# Patient Record
Sex: Male | Born: 1980 | Race: White | Hispanic: No | Marital: Single | Smoking: Current every day smoker
Health system: Southern US, Community
[De-identification: ages and names within clinical notes are randomized; demographics above are authoritative.]

## PROBLEM LIST (undated history)

## (undated) DIAGNOSIS — I639 Cerebral infarction, unspecified: Secondary | ICD-10-CM

## (undated) HISTORY — PX: HERNIA REPAIR: SHX51

## (undated) SURGERY — ANTERIOR CERVICAL CORPECTOMY
Anesthesia: General | Laterality: Bilateral

---

## 2016-12-12 ENCOUNTER — Encounter: Payer: Self-pay | Admitting: Emergency Medicine

## 2016-12-12 ENCOUNTER — Inpatient Hospital Stay
Admission: EM | Admit: 2016-12-12 | Discharge: 2016-12-16 | DRG: 682 | Disposition: A | Discharge status: Home / Self Care | Payer: Self-pay | Attending: Internal Medicine | Admitting: Internal Medicine

## 2016-12-12 DIAGNOSIS — S30820A Blister (nonthermal) of lower back and pelvis, initial encounter: Secondary | ICD-10-CM | POA: Diagnosis present

## 2016-12-12 DIAGNOSIS — I639 Cerebral infarction, unspecified: Secondary | ICD-10-CM

## 2016-12-12 DIAGNOSIS — R740 Nonspecific elevation of levels of transaminase and lactic acid dehydrogenase [LDH]: Secondary | ICD-10-CM | POA: Diagnosis present

## 2016-12-12 DIAGNOSIS — E860 Dehydration: Secondary | ICD-10-CM | POA: Diagnosis present

## 2016-12-12 DIAGNOSIS — S1092XA Blister (nonthermal) of unspecified part of neck, initial encounter: Secondary | ICD-10-CM | POA: Diagnosis present

## 2016-12-12 DIAGNOSIS — T670XXA Heatstroke and sunstroke, initial encounter: Secondary | ICD-10-CM | POA: Diagnosis present

## 2016-12-12 DIAGNOSIS — E871 Hypo-osmolality and hyponatremia: Secondary | ICD-10-CM

## 2016-12-12 DIAGNOSIS — R7401 Elevation of levels of liver transaminase levels: Secondary | ICD-10-CM

## 2016-12-12 DIAGNOSIS — E872 Acidosis: Secondary | ICD-10-CM | POA: Diagnosis present

## 2016-12-12 DIAGNOSIS — M6282 Rhabdomyolysis: Secondary | ICD-10-CM

## 2016-12-12 DIAGNOSIS — G9341 Metabolic encephalopathy: Secondary | ICD-10-CM | POA: Diagnosis present

## 2016-12-12 DIAGNOSIS — N179 Acute kidney failure, unspecified: Principal | ICD-10-CM

## 2016-12-12 DIAGNOSIS — R7989 Other specified abnormal findings of blood chemistry: Secondary | ICD-10-CM

## 2016-12-12 DIAGNOSIS — F17210 Nicotine dependence, cigarettes, uncomplicated: Secondary | ICD-10-CM | POA: Diagnosis present

## 2016-12-12 DIAGNOSIS — E162 Hypoglycemia, unspecified: Secondary | ICD-10-CM | POA: Diagnosis present

## 2016-12-12 DIAGNOSIS — F1010 Alcohol abuse, uncomplicated: Secondary | ICD-10-CM | POA: Diagnosis present

## 2016-12-12 DIAGNOSIS — L8990 Pressure ulcer of unspecified site, unspecified stage: Secondary | ICD-10-CM | POA: Insufficient documentation

## 2016-12-12 DIAGNOSIS — R4182 Altered mental status, unspecified: Secondary | ICD-10-CM

## 2016-12-12 DIAGNOSIS — T675XXA Heat exhaustion, unspecified, initial encounter: Secondary | ICD-10-CM | POA: Diagnosis present

## 2016-12-12 DIAGNOSIS — X30XXXA Exposure to excessive natural heat, initial encounter: Secondary | ICD-10-CM

## 2016-12-12 LAB — URINALYSIS, COMPLETE (UACMP) WITH MICROSCOPIC
BACTERIA UA: NONE SEEN
BILIRUBIN URINE: NEGATIVE
Glucose, UA: 50 mg/dL — AB
KETONES UR: NEGATIVE mg/dL
Leukocytes, UA: NEGATIVE
Nitrite: NEGATIVE
PROTEIN: 30 mg/dL — AB
RBC / HPF: NONE SEEN RBC/hpf (ref 0–5)
SPECIFIC GRAVITY, URINE: 1.013 (ref 1.005–1.030)
pH: 5 (ref 5.0–8.0)

## 2016-12-12 LAB — URINE DRUG SCREEN, QUALITATIVE (ARMC ONLY)
Amphetamines, Ur Screen: NOT DETECTED
BARBITURATES, UR SCREEN: NOT DETECTED
BENZODIAZEPINE, UR SCRN: POSITIVE — AB
Cannabinoid 50 Ng, Ur ~~LOC~~: NOT DETECTED
Cocaine Metabolite,Ur ~~LOC~~: NOT DETECTED
MDMA (Ecstasy)Ur Screen: NOT DETECTED
Methadone Scn, Ur: NOT DETECTED
Opiate, Ur Screen: NOT DETECTED
Phencyclidine (PCP) Ur S: NOT DETECTED
TRICYCLIC, UR SCREEN: NOT DETECTED

## 2016-12-12 LAB — CBC
HEMATOCRIT: 45.6 % (ref 40.0–52.0)
HEMOGLOBIN: 16 g/dL (ref 13.0–18.0)
MCH: 31.4 pg (ref 26.0–34.0)
MCHC: 35.1 g/dL (ref 32.0–36.0)
MCV: 89.6 fL (ref 80.0–100.0)
PLATELETS: 353 10*3/uL (ref 150–440)
RBC: 5.09 MIL/uL (ref 4.40–5.90)
RDW: 13.1 % (ref 11.5–14.5)
WBC: 20.9 10*3/uL — ABNORMAL HIGH (ref 3.8–10.6)

## 2016-12-12 LAB — PROTIME-INR
INR: 0.98
Prothrombin Time: 13 seconds (ref 11.4–15.2)

## 2016-12-12 LAB — LACTIC ACID, PLASMA
Lactic Acid, Venous: 2.4 mmol/L (ref 0.5–1.9)
Lactic Acid, Venous: 1.2 mmol/L (ref 0.5–1.9)

## 2016-12-12 LAB — COMPREHENSIVE METABOLIC PANEL
ALBUMIN: 3.6 g/dL (ref 3.5–5.0)
ALT: 363 U/L — ABNORMAL HIGH (ref 17–63)
ANION GAP: 14 (ref 5–15)
AST: 682 U/L — AB (ref 15–41)
Alkaline Phosphatase: 75 U/L (ref 38–126)
BUN: 96 mg/dL — AB (ref 6–20)
CHLORIDE: 91 mmol/L — AB (ref 101–111)
CO2: 20 mmol/L — ABNORMAL LOW (ref 22–32)
Calcium: 8.3 mg/dL — ABNORMAL LOW (ref 8.9–10.3)
Creatinine, Ser: 4.16 mg/dL — ABNORMAL HIGH (ref 0.61–1.24)
GFR calc Af Amer: 20 mL/min — ABNORMAL LOW (ref >60)
GFR calc non Af Amer: 17 mL/min — ABNORMAL LOW (ref >60)
GLUCOSE: 124 mg/dL — AB (ref 65–99)
POTASSIUM: 4.5 mmol/L (ref 3.5–5.1)
Sodium: 125 mmol/L — ABNORMAL LOW (ref 135–145)
Total Bilirubin: 1 mg/dL (ref 0.3–1.2)
Total Protein: 7.5 g/dL (ref 6.5–8.1)

## 2016-12-12 LAB — PHOSPHORUS: PHOSPHORUS: 1.4 mg/dL — AB (ref 2.5–4.6)

## 2016-12-12 LAB — AMMONIA: AMMONIA: 11 umol/L (ref 9–35)

## 2016-12-12 LAB — CK: CK TOTAL: 43569 U/L — AB (ref 49–397)

## 2016-12-12 LAB — ETHANOL

## 2016-12-12 LAB — APTT: aPTT: 27 seconds (ref 24–36)

## 2016-12-12 MED ORDER — ACETAMINOPHEN 325 MG PO TABS: 650.0000 mg | ORAL_TABLET | Freq: Four times a day (QID) | ORAL | Status: DC | PRN

## 2016-12-12 MED ORDER — PNEUMOCOCCAL VAC POLYVALENT 25 MCG/0.5ML IJ INJ
0.5000 mL | INJECTION | INTRAMUSCULAR | Status: AC
  Administered 2016-12-15: 0.5 mL via INTRAMUSCULAR
  Filled 2016-12-12: qty 0.5

## 2016-12-12 MED ORDER — BISACODYL 5 MG PO TBEC: 5.0000 mg | DELAYED_RELEASE_TABLET | Freq: Every day | ORAL | Status: DC | PRN

## 2016-12-12 MED ORDER — SODIUM CHLORIDE 0.9 % IV SOLN
INTRAVENOUS | Status: DC
  Administered 2016-12-12: 23:00:00 via INTRAVENOUS
  Administered 2016-12-13 (×2): 1000 mL via INTRAVENOUS
  Administered 2016-12-13 – 2016-12-16 (×9): via INTRAVENOUS

## 2016-12-12 MED ORDER — ONDANSETRON HCL 4 MG/2ML IJ SOLN
4.0000 mg | Freq: Four times a day (QID) | INTRAMUSCULAR | Status: DC | PRN
  Administered 2016-12-16: 4 mg via INTRAVENOUS
  Filled 2016-12-12: qty 2

## 2016-12-12 MED ORDER — MAGNESIUM CITRATE PO SOLN
1.0000 | Freq: Once | ORAL | Status: DC | PRN
  Filled 2016-12-12: qty 296

## 2016-12-12 MED ORDER — ACETAMINOPHEN 650 MG RE SUPP: 650.0000 mg | Freq: Four times a day (QID) | RECTAL | Status: DC | PRN

## 2016-12-12 MED ORDER — ALBUTEROL SULFATE (2.5 MG/3ML) 0.083% IN NEBU: 2.5000 mg | INHALATION_SOLUTION | Freq: Four times a day (QID) | RESPIRATORY_TRACT | Status: DC | PRN

## 2016-12-12 MED ORDER — OXYCODONE HCL 5 MG PO TABS
5.0000 mg | ORAL_TABLET | ORAL | Status: DC | PRN
  Administered 2016-12-15 – 2016-12-16 (×5): 5 mg via ORAL
  Filled 2016-12-12 (×6): qty 1

## 2016-12-12 MED ORDER — SODIUM CHLORIDE 0.9 % IV BOLUS (SEPSIS)
1000.0000 mL | Freq: Once | INTRAVENOUS | Status: AC
  Administered 2016-12-12: 1000 mL via INTRAVENOUS

## 2016-12-12 MED ORDER — ZOLPIDEM TARTRATE 5 MG PO TABS: 5.0000 mg | ORAL_TABLET | Freq: Every evening | ORAL | Status: DC | PRN

## 2016-12-12 MED ORDER — ONDANSETRON HCL 4 MG PO TABS: 4.0000 mg | ORAL_TABLET | Freq: Four times a day (QID) | ORAL | Status: DC | PRN

## 2016-12-12 MED ORDER — SENNOSIDES-DOCUSATE SODIUM 8.6-50 MG PO TABS
1.0000 | ORAL_TABLET | Freq: Every evening | ORAL | Status: DC | PRN
  Filled 2016-12-12: qty 1

## 2016-12-12 MED ORDER — IPRATROPIUM BROMIDE 0.02 % IN SOLN: 0.5000 mg | Freq: Four times a day (QID) | RESPIRATORY_TRACT | Status: DC | PRN

## 2016-12-12 NOTE — ED Triage Notes (Signed)
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  Passed out in the [place], with the door open, and was found Sunday morning.  Patient was confused.  Patient was "cooled down" by family.  Had some vomiting.  Started drinking fluids Sunday night and Monday.  Confusion persists.

## 2016-12-12 NOTE — ED Provider Notes (Signed)
[facility] Emergency Department Provider Note  ________________________________________   I have reviewed the triage vital signs and the nursing notes.   HISTORY  Chief Complaint Altered Mental Status   History limited by: Altered mental status, history primarily obtained from wife   HPI Donald Becker is a 36 y.o. male who presents to the emergency department today because of concerns for continued altered mental status. The wife states that he was last seen normal 3 mornings ago. 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 After coming home he had a sixpack of beer. Wife states that he passed out in his [place]. She states it was not running. The next morning the family was having a hard time waking him. He did spend the night in the [place]. Eventually they were able to get him on the ground and they tried calling him off with a hose and water. They then brought him into the garage where they kept fans on him. The family continue to watch the patient yesterday. He however did not recover his mental status. The patient himself denies any pain. He denies any headaches.   History reviewed. No pertinent past medical history.  There are no active problems to display for this patient.   History reviewed. No pertinent surgical history.  Prior to Admission medications   Not on File    Allergies Patient has no known allergies.  No family history on file.  Social History Social History  Substance Use Topics  . Smoking status: Current Every Day Smoker    Types: Cigarettes  . Smokeless tobacco: Never Used  . Alcohol use Yes     Comment: 2-3 days per week    Review of Systems Unable to obtain reliable ROS given AMS  ____________________________________________   PHYSICAL EXAM:  VITAL SIGNS: ED Triage Vitals  Enc Vitals Group     BP 12/12/16 1721 (!) 136/94     Pulse Rate 12/12/16 1721 (!) 102     Resp 12/12/16 1721 16     Temp 12/12/16 1721 98.7  F (37.1 C)     Temp Source 12/12/16 1721 Oral     SpO2 12/12/16 1721 96 %     Weight 12/12/16 1718 250 lb (113.4 kg)     Height 12/12/16 1718 5\' 11"  (1.803 m)   Constitutional: Awake and alert. Not oriented to events, location or date. Eyes: Conjunctivae are normal.  ENT   Head: Normocephalic and atraumatic.   Nose: No congestion/rhinnorhea.   Mouth/Throat: Mucous membranes are moist.   Neck: No stridor. Hematological/Lymphatic/Immunilogical: No cervical lymphadenopathy. Cardiovascular: Tachycardic, regular rhythm.  No murmurs, rubs, or gallops.  Respiratory: Normal respiratory effort without tachypnea nor retractions. Breath sounds are clear and equal bilaterally. No wheezes/rales/rhonchi. Gastrointestinal: Soft and non tender. No rebound. No guarding.  Genitourinary: Deferred Musculoskeletal: Normal range of motion in all extremities. No lower extremity edema. Neurologic:  Awake and alert. Not oriented to event, place or year. Normal speech and language.  Skin:  Skin is warm, dry and intact. No rash noted.  ____________________________________________    LABS (pertinent positives/negatives)  Labs Reviewed  COMPREHENSIVE METABOLIC PANEL - Abnormal; Notable for the following:       Result Value   Sodium 125 (*)    Chloride 91 (*)    CO2 20 (*)    Glucose, Bld 124 (*)    BUN 96 (*)    Creatinine, Ser 4.16 (*)    Calcium 8.3 (*)    AST  682 (*)    ALT 363 (*)    GFR calc non Af Amer 17 (*)    GFR calc Af Amer 20 (*)    All other components within normal limits  CBC - Abnormal; Notable for the following:    WBC 20.9 (*)    All other components within normal limits  URINALYSIS, COMPLETE (UACMP) WITH MICROSCOPIC - Abnormal; Notable for the following:    Color, Urine YELLOW (*)    APPearance HAZY (*)    Glucose, UA 50 (*)    Hgb urine dipstick LARGE (*)    Protein, ur 30 (*)    Squamous Epithelial / LPF 0-5 (*)    All other components within normal limits   URINE DRUG SCREEN, QUALITATIVE ([facility] ONLY) - Abnormal; Notable for the following:    Benzodiazepine, Ur Scrn POSITIVE (*)    All other components within normal limits  CK - Abnormal; Notable for the following:    Total CK 43,569 (*)    All other components within normal limits  LACTIC ACID, PLASMA - Abnormal; Notable for the following:    Lactic Acid, Venous 2.4 (*)    All other components within normal limits  PHOSPHORUS - Abnormal; Notable for the following:    Phosphorus 1.4 (*)    All other components within normal limits  ETHANOL  AMMONIA  LACTIC ACID, PLASMA  PROTIME-INR  APTT  HIV ANTIBODY (ROUTINE TESTING)  MAGNESIUM  PHOSPHORUS  CBC  COMPREHENSIVE METABOLIC PANEL  HEPATITIS PANEL, ACUTE  CBG MONITORING, ED     ____________________________________________   EKG  I, Phineas Semen, attending physician, personally viewed and interpreted this EKG  EKG Time: 2128 Rate: 80 Rhythm: normal sinus rhythm Axis: right axis deviation Intervals: qtc 427 QRS: narrow ST changes: no st elevation Impression: abnormal ekg   ____________________________________________    RADIOLOGY  None  ____________________________________________   PROCEDURES  Procedures  CRITICAL CARE Performed by: Phineas Semen   Total critical care time: 35 minutes  Critical care time was exclusive of separately billable procedures and treating other patients.  Critical care was necessary to treat or prevent imminent or life-threatening deterioration.  Critical care was time spent personally by me on the following activities: development of treatment plan with patient and/or surrogate as well as nursing, discussions with consultants, evaluation of patient's response to treatment, examination of patient, obtaining history from patient or surrogate, ordering and performing treatments and interventions, ordering and review of laboratory studies, ordering and review of radiographic  studies, pulse oximetry and re-evaluation of patient's condition.  ____________________________________________   INITIAL IMPRESSION / ASSESSMENT AND PLAN / ED COURSE  Pertinent labs & imaging results that were available during my care of the patient were reviewed by me and considered in my medical decision making (see chart for details).  Patient presents to the emergency department today because of concerns for altered mental status. This unfortunate has been going on for a few days. The patient's blood work had multiple significant derangements including elevated creatinine and AST and ALT. These findings were concerning for acute stroke. This could explain the patient's altered mental status and would fit with history. CK was added on and was again grossly elevated. Patient was started on IV fluids here in the emergency department. Patient will be admitted to the hospital service.  ____________________________________________   FINAL CLINICAL IMPRESSION(S) / ED DIAGNOSES  Final diagnoses:  AKI (acute kidney injury) (HCC)  Altered mental status, unspecified altered mental status type  Elevated lactic  acid level  Hyponatremia  Non-traumatic rhabdomyolysis  Elevated AST (SGOT)  Elevated ALT measurement     Note: This dictation was prepared with Dragon dictation. Any transcriptional errors that result from this process are unintentional     Phineas SemenGoodman, Atwell Mcdanel, MD 12/12/16 [phone]
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
## 2016-12-12 NOTE — ED Notes (Signed)
Urine sent

## 2016-12-13 LAB — COMPREHENSIVE METABOLIC PANEL
ALT: 319 U/L — AB (ref 17–63)
AST: 499 U/L — ABNORMAL HIGH (ref 15–41)
Albumin: 3.1 g/dL — ABNORMAL LOW (ref 3.5–5.0)
Alkaline Phosphatase: 63 U/L (ref 38–126)
Anion gap: 8 (ref 5–15)
BUN: 86 mg/dL — ABNORMAL HIGH (ref 6–20)
CHLORIDE: 101 mmol/L (ref 101–111)
CO2: 22 mmol/L (ref 22–32)
Calcium: 7.8 mg/dL — ABNORMAL LOW (ref 8.9–10.3)
Creatinine, Ser: 3.36 mg/dL — ABNORMAL HIGH (ref 0.61–1.24)
GFR, EST AFRICAN AMERICAN: 26 mL/min — AB (ref >60)
GFR, EST NON AFRICAN AMERICAN: 22 mL/min — AB (ref >60)
Glucose, Bld: 101 mg/dL — ABNORMAL HIGH (ref 65–99)
Potassium: 3.9 mmol/L (ref 3.5–5.1)
Sodium: 131 mmol/L — ABNORMAL LOW (ref 135–145)
Total Bilirubin: 0.8 mg/dL (ref 0.3–1.2)
Total Protein: 6.4 g/dL — ABNORMAL LOW (ref 6.5–8.1)

## 2016-12-13 LAB — CBC
HCT: 40.1 % (ref 40.0–52.0)
Hemoglobin: 14.1 g/dL (ref 13.0–18.0)
MCH: 31.6 pg (ref 26.0–34.0)
MCHC: 35.2 g/dL (ref 32.0–36.0)
MCV: 89.7 fL (ref 80.0–100.0)
PLATELETS: 177 10*3/uL (ref 150–440)
RBC: 4.47 MIL/uL (ref 4.40–5.90)
RDW: 12.9 % (ref 11.5–14.5)
WBC: 15 10*3/uL — ABNORMAL HIGH (ref 3.8–10.6)

## 2016-12-13 LAB — CK: CK TOTAL: 38497 U/L — AB (ref 49–397)

## 2016-12-13 LAB — MAGNESIUM: MAGNESIUM: 2.4 mg/dL (ref 1.7–2.4)

## 2016-12-13 LAB — PHOSPHORUS: Phosphorus: 5.3 mg/dL — ABNORMAL HIGH (ref 2.5–4.6)

## 2016-12-13 LAB — GLUCOSE, CAPILLARY: GLUCOSE-CAPILLARY: 128 mg/dL — AB (ref 65–99)

## 2016-12-13 MED ORDER — SILVER SULFADIAZINE 1 % EX CREA
TOPICAL_CREAM | Freq: Two times a day (BID) | CUTANEOUS | Status: DC
  Administered 2016-12-13 – 2016-12-16 (×7): via TOPICAL
  Filled 2016-12-13: qty 25

## 2016-12-13 MED ORDER — ZINC OXIDE 40 % EX OINT
TOPICAL_OINTMENT | Freq: Two times a day (BID) | CUTANEOUS | Status: DC
  Administered 2016-12-13 – 2016-12-16 (×7): via TOPICAL
  Filled 2016-12-13: qty 57
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
## 2016-12-13 NOTE — Progress Notes (Signed)
[facility] - [place] [facility]   PATIENT NAME: Donald Becker    MR#:  [id number]  DATE OF BIRTH:  Oct 23, 1980  SUBJECTIVE:   Family room. Patient was found in his truck for unknown amount of hours dehydrated. On Tuesday he began to have some confusion so family brought him to the ER for further evaluation where he was diagnosed with acute rhabdomyolysis.  REVIEW OF SYSTEMS:    Review of Systems  Patient is just snoring he is arousable and apparently was talking with the family but right now does not purchase. Review of systems   Tolerating Diet:yes      DRUG ALLERGIES:  No Known Allergies  VITALS:  Blood pressure 139/81, pulse 77, temperature 98 F (36.7 C), temperature source Oral, resp. rate 20, height 5\' 11"  (1.803 m), weight 118.6 kg (261 lb 6.4 oz), SpO2 97 %.  PHYSICAL EXAMINATION:  Constitutional: Appears well-developed and well-nourished. No distress. HENT: Normocephalic. . Eyes: Conjunctivae and EOM are normal. PERRLA, no scleral icterus.  Neck:No JVD. No tracheal deviation. CVS: RRR, S1/S2 +, no murmurs, no gallops, no carotid bruit.  Pulmonary: Effort and breath sounds normal, no stridor, rhonchi, wheezes, rales.  Abdominal: Soft. BS +,  no distension, tenderness, rebound or guarding.  Musculoskeletal: Normal range of motion. No edema and no tenderness.  Neuro:Sleeping and snoring  Skin: Skin is warm and dry. No rash noted. Psychiatric: Unable to assess   LABORATORY PANEL:   CBC  Recent Labs Lab 12/13/16 0446  WBC 15.0*  HGB 14.1  HCT 40.1  PLT 177   ------------------------------------------------------------------------------------------------------------------  Chemistries   Recent Labs Lab 12/12/16 2314 12/13/16 0446  NA  --  131*  K  --  3.9  CL  --  101  CO2  --  22  GLUCOSE  --  101*  BUN  --  86*  CREATININE  --  3.36*  CALCIUM  --  7.8*  MG 2.4  --   AST  --  499*  ALT  --  319*  ALKPHOS  --  63  BILITOT   --  0.8   ------------------------------------------------------------------------------------------------------------------  Cardiac Enzymes No results for input(s): TROPONINI in the last 168 hours. ------------------------------------------------------------------------------------------------------------------  RADIOLOGY:  No results found.   ASSESSMENT AND PLAN:   36 year old male with no past medical history who is admitted with acute encephalopathy and acute rhabdomyolysis.  1. Acute metabolic encephalopathy in setting of acute kidney injury and acute rhabdomyolysis Mental status has improved and is at baseline as per family.  2. Acute rhabdo is due to heat exhaustion: Continue aggressive hydration. Continue to monitor BMP CK pending  3. Acute kidney injury in the setting of dehydration and rhabdo Continue aggressive hydration and repeat BMP in a.m. Creatinine showing improvement  4. Transaminitis: This is due to dehydration with mild ischemia and EtOH abuse AST and ALT are improving 5. Hyponatremia due to dehydration: This is improved with IV fluids 6. Leukocytosis purely due to dehydration which is improving with IV fluids    Management plans discussed with the patient's family and they are in agreement.  CODE STATUS: full  TOTAL TIME TAKING CARE OF THIS PATIENT: 30 minutes.     POSSIBLE D/C 2 days, DEPENDING ON CLINICAL CONDITION.   Donald Becker M.D on 12/13/2016 at 9:22 AM  Between 7am to 6pm - Pager - [phone] After 6pm go to [email or web address] - Social research officer, government  [facility] [place] [facility]  Office  [phone]  CC: Primary care physician;  Patient, No Pcp Per  Note: This dictation was prepared with Dragon dictation along with smaller phrase technology. Any transcriptional errors that result from this process are unintentional.

## 2016-12-14 ENCOUNTER — Inpatient Hospital Stay: Payer: Self-pay

## 2016-12-14 DIAGNOSIS — L8990 Pressure ulcer of unspecified site, unspecified stage: Secondary | ICD-10-CM | POA: Insufficient documentation

## 2016-12-14 LAB — HEPATIC FUNCTION PANEL
ALT: 257 U/L — ABNORMAL HIGH (ref 17–63)
AST: 314 U/L — ABNORMAL HIGH (ref 15–41)
Albumin: 2.9 g/dL — ABNORMAL LOW (ref 3.5–5.0)
Alkaline Phosphatase: 67 U/L (ref 38–126)
BILIRUBIN DIRECT: 0.1 mg/dL (ref 0.1–0.5)
BILIRUBIN INDIRECT: 0.5 mg/dL (ref 0.3–0.9)
Total Bilirubin: 0.6 mg/dL (ref 0.3–1.2)
Total Protein: 6.1 g/dL — ABNORMAL LOW (ref 6.5–8.1)

## 2016-12-14 LAB — BASIC METABOLIC PANEL
Anion gap: 6 (ref 5–15)
BUN: 53 mg/dL — AB (ref 6–20)
CO2: 24 mmol/L (ref 22–32)
CREATININE: 1.91 mg/dL — AB (ref 0.61–1.24)
Calcium: 8 mg/dL — ABNORMAL LOW (ref 8.9–10.3)
Chloride: 105 mmol/L (ref 101–111)
GFR, EST AFRICAN AMERICAN: 51 mL/min — AB (ref >60)
GFR, EST NON AFRICAN AMERICAN: 44 mL/min — AB (ref >60)
Glucose, Bld: 114 mg/dL — ABNORMAL HIGH (ref 65–99)
Potassium: 3.9 mmol/L (ref 3.5–5.1)
SODIUM: 135 mmol/L (ref 135–145)

## 2016-12-14 LAB — CK
CK TOTAL: 14240 U/L — AB (ref 49–397)
CK TOTAL: 12065 U/L — AB (ref 49–397)

## 2016-12-14 LAB — HIV ANTIBODY (ROUTINE TESTING W REFLEX): HIV SCREEN 4TH GENERATION: NONREACTIVE

## 2016-12-14 MED ORDER — ZINC OXIDE 40 % EX OINT: TOPICAL_OINTMENT | Freq: Two times a day (BID) | CUTANEOUS | 0 refills | Status: DC

## 2016-12-14 MED ORDER — ASPIRIN EC 81 MG PO TBEC
81.0000 mg | DELAYED_RELEASE_TABLET | Freq: Every day | ORAL | Status: DC
  Administered 2016-12-14 – 2016-12-16 (×3): 81 mg via ORAL
  Filled 2016-12-14 (×3): qty 1

## 2016-12-14 MED ORDER — SILVER SULFADIAZINE 1 % EX CREA: TOPICAL_CREAM | Freq: Two times a day (BID) | CUTANEOUS | 0 refills | Status: DC

## 2016-12-14 NOTE — Progress Notes (Signed)
[facility] - [place] [facility]   PATIENT NAME: Donald Becker    MR#:  [id number]  DATE OF BIRTH:  05/01/1981  SUBJECTIVE:   Patient is alert this am not c/o pain  Family worried about mental confusion  REVIEW OF SYSTEMS:      Review of Systems  Constitutional: Negative for fever, chills weight loss HENT: Negative for ear pain, nosebleeds, congestion, facial swelling, rhinorrhea, neck pain, neck stiffness and ear discharge.   Respiratory: Negative for cough, shortness of breath, wheezing  Cardiovascular: Negative for chest pain, and leg swelling.  Palpitations when ambulating Gastrointestinal: Negative for heartburn, abdominal pain, vomiting, diarrhea or consitpation Genitourinary: Negative for dysuria, urgency, frequency, hematuria Musculoskeletal: Negative for back pain or joint pain Neurological: Negative for dizziness, seizures, syncope, focal weakness,  numbness and headaches.  Hematological: Does not bruise/bleed easily.  Psychiatric/Behavioral: Negative for hallucinations, confusion, dysphoric mood  Tolerating Diet:yes      DRUG ALLERGIES:  No Known Allergies  VITALS:  Blood pressure 120/70, pulse 77, temperature 98.1 F (36.7 C), temperature source Oral, resp. rate 19, height 5\' 11"  (1.803 m), weight 118.6 kg (261 lb 6.4 oz), SpO2 99 %.  PHYSICAL EXAMINATION:  Constitutional: Appears well-developed and well-nourished. No distress. HENT: Normocephalic. . Eyes: Conjunctivae and EOM are normal. PERRLA, no scleral icterus.  Neck:No JVD. No tracheal deviation. CVS: RRR, S1/S2 +, no murmurs, no gallops, no carotid bruit.  Pulmonary: Effort and breath sounds normal, no stridor, rhonchi, wheezes, rales.  Abdominal: Soft. BS +,  no distension, tenderness, rebound or guarding.  Musculoskeletal: Normal range of motion. No edema and no tenderness.  Neuro: CN 2-12 intact Skin: swelling back of neck with bumps Psychiatric:normal mood  LABORATORY  PANEL:   CBC  Recent Labs Lab 12/13/16 0446  WBC 15.0*  HGB 14.1  HCT 40.1  PLT 177   ------------------------------------------------------------------------------------------------------------------  Chemistries   Recent Labs Lab 12/12/16 2314  12/14/16 0419  NA  --   < > 135  K  --   < > 3.9  CL  --   < > 105  CO2  --   < > 24  GLUCOSE  --   < > 114*  BUN  --   < > 53*  CREATININE  --   < > 1.91*  CALCIUM  --   < > 8.0*  MG 2.4  --   --   AST  --   < > 314*  ALT  --   < > 257*  ALKPHOS  --   < > 67  BILITOT  --   < > 0.6  < > = values in this interval not displayed. ------------------------------------------------------------------------------------------------------------------  Cardiac Enzymes No results for input(s): TROPONINI in the last 168 hours. ------------------------------------------------------------------------------------------------------------------  RADIOLOGY:  Ct Head Wo Contrast  Result Date: 12/14/2016 CLINICAL DATA:  Altered mental status. EXAM: CT HEAD WITHOUT CONTRAST TECHNIQUE: Contiguous axial images were obtained from the base of the skull through the vertex without intravenous contrast. COMPARISON:  None. 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 Bilateral basal ganglia lacunar infarcts also noted, left more pronounced than right. These are age indeterminate. No hemorrhage or hydrocephalus. Vascular: No hyperdense vessel or unexpected calcification. Skull: No acute calvarial abnormality. Sinuses/Orbits: Visualized paranasal sinuses and mastoids clear. Orbital soft tissues unremarkable. Other: Soft tissue swelling/ low-density fluid noted in the posterior scalp. IMPRESSION: Areas of age-indeterminate infarction and both occipital lobes and bilateral basal  ganglia. Electronically Signed   By: Charlett Nose M.D.   On: 12/14/2016 15:38     ASSESSMENT AND PLAN:    36 year old male with no past medical history who is admitted with acute encephalopathy and acute rhabdomyolysis.  1. Acute metabolic encephalopathy in setting of acute kidney injury and acute rhabdomyolysis Mental status has improved. Today family concerned will order CT HEAD  2. Acute rhabdo is due to heat exhaustion: Continue aggressive hydration. Continue to monitor BMP CK pending  3. Acute kidney injury in the setting of dehydration and rhabdo Continue aggressive hydration and repeat BMP in a.m. Creatinine showing improvement  4. Transaminitis: This is due to dehydration with mild ischemia and EtOH abuse AST and ALT are improving 5. Hyponatremia due to dehydration: This is improved with IV fluids 6. Leukocytosis purely due to dehydration which is improving with IV fluids    Management plans discussed with the patient's family and they are in agreement.  CODE STATUS: full  TOTAL TIME TAKING CARE OF THIS PATIENT: 25 minutes.     POSSIBLE D/C 2 days, DEPENDING ON CLINICAL CONDITION.   Jatasia Gundrum M.D on 12/14/2016 at 8:16 PM  Between 7am to 6pm - Pager - [phone] After 6pm go to [email or web address] - password [email or web address]  [facility]  Office  [phone]  CC: Primary care physician; Patient, No Pcp Per  Note: This dictation was prepared with Dragon dictation along with smaller phrase technology. Any transcriptional errors that result from this process are unintentional.

## 2016-12-14 NOTE — Progress Notes (Addendum)
Okay per Dr. Mody for MRiJuliene Becker to be done tomorrow. Per Dr. [name] pt does not need to transfer to 1C at this time as no orders for a stoke assessment have been placed.

## 2016-12-14 NOTE — Progress Notes (Signed)
HEad CT shows possible CVA D/w dr Thad Rangerreynolds not CODE STROKE Oder  Mri/mra Echo Carotids Asa

## 2016-12-14 NOTE — Progress Notes (Signed)
Spoke with Dr. [name] and she is aware of Ct results.

## 2016-12-14 NOTE — Progress Notes (Signed)
Per Dr. [name] place order for CT of head without contrast.

## 2016-12-14 NOTE — Care Management (Signed)
Patient admitted with Rhabdomyolysis secondary to heat stroke.  Patient lives at home with spouse.  Patient is uninsured.  [facility]  PT has assessed patient and recommend home health PT. Patient does not qualify for home health PT services, as he does not have insurance.  He also does not have a PCP who could make a referral [facility].  Patient was able to ambulate 300 feet with PT.  Patient was provided a RW that was [facility].  Patient is not discharging on any medications that require medication assistance.  RNCM signing off.

## 2016-12-14 NOTE — Discharge Instructions (Signed)
Acute Kidney Injury, Adult Acute kidney injury is a sudden worsening of kidney function. The kidneys are organs that have several jobs. They filter the blood to remove waste products and extra fluid. They also maintain a healthy balance of minerals and hormones in the body, which helps control blood pressure and keep bones strong. With this condition, your kidneys do not do their jobs as well as they should. This condition ranges from mild to severe. Over time it may develop into long-lasting (chronic) kidney disease. Early detection and treatment may prevent acute kidney injury from developing into a chronic condition. What are the causes? Common causes of this condition include:  A problem with blood flow to the kidneys. This may be caused by:  Low blood pressure (hypotension) or shock.  Blood loss.  Heart and blood vessel (cardiovascular) disease.  Severe burns.  Liver disease.  Direct damage to the kidneys. This may be caused by:  Certain medicines.  A kidney infection.  Poisoning.  Being around or in contact with toxic substances.  A surgical wound.  A hard, direct hit to the kidney area.  A sudden blockage of urine flow. This may be caused by:  Cancer.  Kidney stones.  An enlarged prostate in males. What are the signs or symptoms? Symptoms of this condition may not be obvious until the condition becomes severe. Symptoms of this condition can include:  Tiredness (lethargy), or difficulty staying awake.  Nausea or vomiting.  Swelling (edema) of the face, legs, ankles, or feet.  Problems with urination, such as:  Abdominal pain, or pain along the side of your stomach (flank).  Decreased urine production.  Decrease in the force of urine flow.  Muscle twitches and cramps, especially in the legs.  Confusion or trouble concentrating.  Loss of appetite.  Fever. How is this diagnosed? This condition may be diagnosed with tests, including:  Blood  tests.  Urine tests.  Imaging tests.  A test in which a sample of tissue is removed from the kidneys to be examined under a microscope (kidney biopsy). How is this treated? Treatment for this condition depends on the cause and how severe the condition is. In mild cases, treatment may not be needed. The kidneys may heal on their own. In more severe cases, treatment will involve:  Treating the cause of the kidney injury. This may involve changing any medicines you are taking or adjusting your dosage.  Fluids. You may need specialized IV fluids to balance your body's needs.  Having a catheter placed to drain urine and prevent blockages.  Preventing problems from occurring. This may mean avoiding certain medicines or procedures that can cause further injury to the kidneys. In some cases treatment may also require:  A procedure to remove toxic wastes from the body (dialysis or continuous renal replacement therapy - CRRT).  Surgery. This may be done to repair a torn kidney, or to remove the blockage from the urinary system. Follow these instructions at home: Medicines   Take over-the-counter and prescription medicines only as told by your health care provider.  Do not take any new medicines without your health care provider's approval. Many medicines can worsen your kidney damage.  Do not take any vitamin and mineral supplements without your health care provider's approval. Many nutritional supplements can worsen your kidney damage. Lifestyle   If your health care provider prescribed changes to your diet, follow them. You may need to decrease the amount of protein you eat.  Achieve and maintain a   healthy weight. If you need help with this, ask your health care provider.  Start or continue an exercise plan. Try to exercise at least 30 minutes a day, 5 days a week.  Do not use any tobacco products, such as cigarettes, chewing tobacco, and e-cigarettes. If you need help quitting, ask  your health care provider. General instructions   Keep track of your blood pressure. Report changes in your blood pressure as told by your health care provider.  Stay up to date with immunizations. Ask your health care provider which immunizations you need.  Keep all follow-up visits as told by your health care provider. This is important. Where to find more information:  American Association of Kidney Patients: www.aakp.org  National Kidney Foundation: www.kidney.org  American Kidney Fund: www.akfinc.org  Life Options Rehabilitation Program:  www.lifeoptions.org  www.kidneyschool.org Contact a health care provider if:  Your symptoms get worse.  You develop new symptoms. Get help right away if:  You develop symptoms of worsening kidney disease, which include:  Headaches.  Abnormally dark or light skin.  Easy bruising.  Frequent hiccups.  Chest pain.  Shortness of breath.  End of menstruation in women.  Seizures.  Confusion or altered mental status.  Abdominal or back pain.  Itchiness.  You have a fever.  Your body is producing less urine.  You have pain or bleeding when you urinate. Summary  Acute kidney injury is a sudden worsening of kidney function.  Acute kidney injury can be caused by problems with blood flow to the kidneys, direct damage to the kidneys, and sudden blockage of urine flow.  Symptoms of this condition may not be obvious until it becomes severe. Symptoms may include edema, lethargy, confusion, nausea or vomiting, and problems passing urine.  This condition can usually be diagnosed with blood tests, urine tests, and imaging tests. Sometimes a kidney biopsy is done to diagnose this condition.  Treatment for this condition often involves treating the underlying cause. It is treated with fluids, medicines, dialysis, diet changes, or surgery. This information is not intended to replace advice given to you by your health care provider.  Make sure you discuss any questions you have with your health care provider. Document Released: 01/30/2011 Document Revised: 07/07/2016 Document Reviewed: 07/07/2016 Elsevier Interactive Patient Education  2017 [employer]  

## 2016-12-14 NOTE — Discharge Summary (Signed)
[facility] [place] [facility]   PATIENT NAME: Donald Becker    MR#:  [id number]  DATE OF BIRTH:  1981-05-25  DATE OF ADMISSION:  12/12/2016 ADMITTING PHYSICIAN: Tonye Royalty, DO  DATE OF DISCHARGE: 12/16/2016  PRIMARY CARE PHYSICIAN: [facility]    ADMISSION DIAGNOSIS:  AKI (acute kidney injury) (HCC) [N17.9] Elevated lactic acid level [R79.89] Altered mental status, unspecified altered mental status type [R41.82]  DISCHARGE DIAGNOSIS:  Active Problems:   AKI (acute kidney injury) (HCC)   Pressure injury of skin   Altered mental status hypoxic brain injury causing CVA  SECONDARY DIAGNOSIS:  none  HOSPITAL COURSE:   36 year old male with no past medical history who is admitted with acute encephalopathy and acute rhabdomyolysis.  1. Acute metabolic encephalopathy in setting of acute kidney injury and acute rhabdomyolysis and hypoxic brain injury causing CVA  He underwent MRI brain which shows Symmetric injury/infarct of the hippocampus, globus pallidus, cerebellum, and occipital cortex. This is the pattern of hypoxic ischemic injury or toxic/metabolic insult (hypoglycemia, carbon monoxide, ect). ECHO shows mild diastolic dysfunction with normal EF no thrombus.   2. Acute rhabdo is due to heat exhaustion: CPK is trending down with aggressive IV fluids. Patient will continue with hydration at home. Is recommended that he drink at least a gallon of water daily for the next few days. He will need to drink plenty of fluids while he is out in the heat. This was discussed with patient and family.   3. Acute kidney injury in the setting of dehydration and rhabdo  Creatinine has much improved.  4. Transaminitis: This is due to dehydration with mild ischemia and EtOH abuse AST and ALT have improved.  5. Hyponatremia due to dehydration: This has  improved with IV fluids 6. Leukocytosis purely due to dehydration which is improving with IV  fluids  7. NECK /butoock cellulitis/induration:He was evaluated by Surgery and Wound care. He will continue Keflex and :Cleanse neck with soap and water. Apply Silvadene cream, cover with nonadherent telfa, Gauze and tape. Change twice daily. Cleanse right flank and buttocks with soap and water.Apply Boudreaux's butt paste to erythema twice daily  8. Mild diastolic dysfunction on ECHO: He needs [facility] for BP and may benefit from overnight Sleep Study.   DISCHARGE CONDITIONS AND DIET:   Stable for discharge on regular diet   CONSULTS OBTAINED:  Treatment Team:  Lattie Haw, MD Thana Farr, MD  DRUG ALLERGIES:  No Known Allergies  DISCHARGE MEDICATIONS:   Current Discharge Medication List    START taking these medications   Details  aspirin EC 81 MG EC tablet Take 1 tablet (81 mg total) by mouth daily.    cephALEXin (KEFLEX) 500 MG capsule Take 1 capsule (500 mg total) by mouth every 8 (eight) hours. Qty: 18 capsule, Refills: 0    liver oil-zinc oxide (DESITIN) 40 % ointment Apply topically 2 (two) times daily. Qty: 56.7 g, Refills: 0    nicotine (NICODERM CQ - DOSED IN MG/24 HOURS) 14 mg/24hr patch Place 1 patch (14 mg total) onto the skin daily. Qty: 28 patch, Refills: 0    silver sulfADIAZINE (SILVADENE) 1 % cream Apply topically 2 (two) times daily. Qty: 50 g, Refills: 0          Today   CHIEF COMPLAINT:  No acute events. Patient denies pain or shortness of breath   VITAL SIGNS:  Blood pressure 115/63, pulse 72, temperature 97.7 F (  36.5 C), temperature source Oral, resp. rate 16, height 5\' 11"  (1.803 m), weight 118.6 kg (261 lb 6.4 oz), SpO2 96 %.   REVIEW OF SYSTEMS:  Review of Systems  Constitutional: Negative.  Negative for chills, fever and malaise/fatigue.  HENT: Negative.  Negative for ear discharge, ear pain, hearing loss, nosebleeds and sore throat.   Eyes: Negative.  Negative for blurred vision and  pain.  Respiratory: Negative.  Negative for cough, hemoptysis, shortness of breath and wheezing.   Cardiovascular: Negative.  Negative for chest pain, palpitations and leg swelling.  Gastrointestinal: Negative.  Negative for abdominal pain, blood in stool, diarrhea, nausea and vomiting.  Genitourinary: Negative.  Negative for dysuria.  Musculoskeletal: Negative.  Negative for back pain.  Skin:       Neck red and tender  Neurological: Negative for dizziness, tremors, speech change, focal weakness, seizures and headaches.  Endo/Heme/Allergies: Negative.  Does not bruise/bleed easily.  Psychiatric/Behavioral: Positive for memory loss. Negative for depression, hallucinations and suicidal ideas.     PHYSICAL EXAMINATION:  GENERAL:  36 y.o.-year-old patient lying in the bed with no acute distress.  NECK:  Supple, no jugular venous distention. No thyroid enlargement, no tenderness.  LUNGS: Normal breath sounds bilaterally, no wheezing, rales,rhonchi  No use of accessory muscles of respiration.  CARDIOVASCULAR: S1, S2 normal. No murmurs, rubs, or gallops.  ABDOMEN: Soft, non-tender, non-distended. Bowel sounds present. No organomegaly or mass.  EXTREMITIES: No pedal edema, cyanosis, or clubbing.  PSYCHIATRIC: The patient is alert and oriented x 3.  SKIN: . posterior and right lateral neck:  4 cm x 8 cm with scattered serum filled blisters  DATA REVIEW:   CBC  Recent Labs Lab 12/13/16 0446  WBC 15.0*  HGB 14.1  HCT 40.1  PLT 177    Chemistries   Recent Labs Lab 12/12/16 2314  12/14/16 0419 12/16/16 0412  NA  --   < > 135 140  K  --   < > 3.9 3.9  CL  --   < > 105 105  CO2  --   < > 24 29  GLUCOSE  --   < > 114* 114*  BUN  --   < > 53* 20  CREATININE  --   < > 1.91* 1.30*  CALCIUM  --   < > 8.0* 8.5*  MG 2.4  --   --   --   AST  --   < > 314*  --   ALT  --   < > 257*  --   ALKPHOS  --   < > 67  --   BILITOT  --   < > 0.6  --   < > = values in this interval not  displayed.  Cardiac Enzymes No results for input(s): TROPONINI in the last 168 hours.  Microbiology Results  @MICRORSLT48 @  RADIOLOGY:  Ct Head Wo Contrast  Result Date: 12/14/2016 CLINICAL DATA:  Altered mental status. EXAM: CT HEAD WITHOUT CONTRAST TECHNIQUE: Contiguous axial images were obtained from the base of the skull through the vertex without intravenous contrast. COMPARISON:  None. 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 Bilateral basal ganglia lacunar infarcts also noted, left more pronounced than right. These are age indeterminate. No hemorrhage or hydrocephalus. Vascular: No hyperdense vessel or unexpected calcification. Skull: No acute calvarial abnormality. Sinuses/Orbits: Visualized paranasal sinuses and mastoids clear. Orbital soft tissues unremarkable. Other: Soft tissue swelling/ low-density fluid noted in  the posterior scalp. IMPRESSION: Areas of age-indeterminate infarction and both occipital lobes and bilateral basal ganglia. Electronically Signed   By: Charlett Nose M.D.   On: 12/14/2016 15:38   Mr Maxine Glenn Head Wo Contrast  Result Date: 12/15/2016 CLINICAL DATA:  Presented with altered mental status and rhabdomyolysis. Found unresponsive in [place]. EXAM: MRI HEAD WITHOUT AND WITH CONTRAST MRA HEAD WITHOUT CONTRAST MRA NECK WITHOUT AND WITH CONTRAST TECHNIQUE: Multiplanar, multiecho pulse sequences of the brain and surrounding structures were obtained without and with intravenous contrast. Angiographic images of the Circle of Willis were obtained using MRA technique without intravenous contrast. Angiographic images of the neck were obtained using MRA technique without and with intravenous contrast. Carotid stenosis measurements (when applicable) are obtained utilizing NASCET criteria, using the distal internal carotid diameter as the denominator. CONTRAST:  20mL MULTIHANCE GADOBENATE DIMEGLUMINE 529 MG/ML IV SOLN  COMPARISON:  Head CT from yesterday FINDINGS: MRI HEAD FINDINGS Brain: Symmetric restricted diffusion in the hippocampus, occipital cortex, globus pallidus, and superficial cerebellum. These are primarily metabolically active areas, and susceptible to hypoxic ischemic injury, hypoglycemia, or toxic insult -including carbon monoxide. No hemorrhage or hydrocephalus.  No masslike findings. Vascular: Arterial findings below. Normal dural venous sinus flow voids. Skull and upper cervical spine: The upper right neck musculature is T2 hyperintense, expanded, and nonenhancing - consistent with not myonecrosis and correlating with history of rhabdomyolysis. There is a large subgaleal scalp collection mainly posteriorly and right eccentric. Sinuses/Orbits: Mucosal thickening in the right mastoid air cells. Mild patchy mucosal thickening in the paranasal sinuses. MRA HEAD FINDINGS Standard intracranial arterial anatomy. Mild vertebrobasilar tortuosity. There is no stenosis, beading, or major branch occlusion. Negative for aneurysm. MRA NECK FINDINGS Time-of-flight imaging shows antegrade flow in both carotid and vertebral circulations. Postcontrast imaging does not cover the entire left vertebral artery, but the missed segment is normal on time-of-flight. Imaging is motion degraded, but there is no indication of stenosis or dissection. No beading noted. IMPRESSION: 1. Symmetric injury/infarct of the hippocampus, globus pallidus, cerebellum, and occipital cortex. This is the pattern of hypoxic ischemic injury or toxic/metabolic insult (hypoglycemia, carbon monoxide, ect). 2. Extensive necrosis of right posterior cervical musculature, correlating with history of rhabdomyolysis. Contiguous sizable subgaleal fluid collection continuing to the vertex. 3. Negative intracranial and cervical MRA. Electronically Signed   By: Marnee Spring M.D.   On: 12/15/2016 13:03   Mr Maxine Glenn Neck W Wo Contrast  Result Date: 12/15/2016 CLINICAL  DATA:  Presented with altered mental status and rhabdomyolysis. Found unresponsive in [place]. EXAM: MRI HEAD WITHOUT AND WITH CONTRAST MRA HEAD WITHOUT CONTRAST MRA NECK WITHOUT AND WITH CONTRAST TECHNIQUE: Multiplanar, multiecho pulse sequences of the brain and surrounding structures were obtained without and with intravenous contrast. Angiographic images of the Circle of Willis were obtained using MRA technique without intravenous contrast. Angiographic images of the neck were obtained using MRA technique without and with intravenous contrast. Carotid stenosis measurements (when applicable) are obtained utilizing NASCET criteria, using the distal internal carotid diameter as the denominator. CONTRAST:  20mL MULTIHANCE GADOBENATE DIMEGLUMINE 529 MG/ML IV SOLN COMPARISON:  Head CT from yesterday FINDINGS: MRI HEAD FINDINGS Brain: Symmetric restricted diffusion in the hippocampus, occipital cortex, globus pallidus, and superficial cerebellum. These are primarily metabolically active areas, and susceptible to hypoxic ischemic injury, hypoglycemia, or toxic insult -including carbon monoxide. No hemorrhage or hydrocephalus.  No masslike findings. Vascular: Arterial findings below. Normal dural venous sinus flow voids. Skull and upper cervical spine: The upper right neck musculature is  T2 hyperintense, expanded, and nonenhancing - consistent with not myonecrosis and correlating with history of rhabdomyolysis. There is a large subgaleal scalp collection mainly posteriorly and right eccentric. Sinuses/Orbits: Mucosal thickening in the right mastoid air cells. Mild patchy mucosal thickening in the paranasal sinuses. MRA HEAD FINDINGS Standard intracranial arterial anatomy. Mild vertebrobasilar tortuosity. There is no stenosis, beading, or major branch occlusion. Negative for aneurysm. MRA NECK FINDINGS Time-of-flight imaging shows antegrade flow in both carotid and vertebral circulations. Postcontrast imaging does not cover  the entire left vertebral artery, but the missed segment is normal on time-of-flight. Imaging is motion degraded, but there is no indication of stenosis or dissection. No beading noted. IMPRESSION: 1. Symmetric injury/infarct of the hippocampus, globus pallidus, cerebellum, and occipital cortex. This is the pattern of hypoxic ischemic injury or toxic/metabolic insult (hypoglycemia, carbon monoxide, ect). 2. Extensive necrosis of right posterior cervical musculature, correlating with history of rhabdomyolysis. Contiguous sizable subgaleal fluid collection continuing to the vertex. 3. Negative intracranial and cervical MRA. Electronically Signed   By: Marnee SpringJonathon  Watts M.D.   On: 12/15/2016 13:03   Mr Laqueta JeanBrain W ZOWo Contrast  Result Date: 12/15/2016 CLINICAL DATA:  Presented with altered mental status and rhabdomyolysis. Found unresponsive in [place]. EXAM: MRI HEAD WITHOUT AND WITH CONTRAST MRA HEAD WITHOUT CONTRAST MRA NECK WITHOUT AND WITH CONTRAST TECHNIQUE: Multiplanar, multiecho pulse sequences of the brain and surrounding structures were obtained without and with intravenous contrast. Angiographic images of the Circle of Willis were obtained using MRA technique without intravenous contrast. Angiographic images of the neck were obtained using MRA technique without and with intravenous contrast. Carotid stenosis measurements (when applicable) are obtained utilizing NASCET criteria, using the distal internal carotid diameter as the denominator. CONTRAST:  20mL MULTIHANCE GADOBENATE DIMEGLUMINE 529 MG/ML IV SOLN COMPARISON:  Head CT from yesterday FINDINGS: MRI HEAD FINDINGS Brain: Symmetric restricted diffusion in the hippocampus, occipital cortex, globus pallidus, and superficial cerebellum. These are primarily metabolically active areas, and susceptible to hypoxic ischemic injury, hypoglycemia, or toxic insult -including carbon monoxide. No hemorrhage or hydrocephalus.  No masslike findings. Vascular: Arterial  findings below. Normal dural venous sinus flow voids. Skull and upper cervical spine: The upper right neck musculature is T2 hyperintense, expanded, and nonenhancing - consistent with not myonecrosis and correlating with history of rhabdomyolysis. There is a large subgaleal scalp collection mainly posteriorly and right eccentric. Sinuses/Orbits: Mucosal thickening in the right mastoid air cells. Mild patchy mucosal thickening in the paranasal sinuses. MRA HEAD FINDINGS Standard intracranial arterial anatomy. Mild vertebrobasilar tortuosity. There is no stenosis, beading, or major branch occlusion. Negative for aneurysm. MRA NECK FINDINGS Time-of-flight imaging shows antegrade flow in both carotid and vertebral circulations. Postcontrast imaging does not cover the entire left vertebral artery, but the missed segment is normal on time-of-flight. Imaging is motion degraded, but there is no indication of stenosis or dissection. No beading noted. IMPRESSION: 1. Symmetric injury/infarct of the hippocampus, globus pallidus, cerebellum, and occipital cortex. This is the pattern of hypoxic ischemic injury or toxic/metabolic insult (hypoglycemia, carbon monoxide, ect). 2. Extensive necrosis of right posterior cervical musculature, correlating with history of rhabdomyolysis. Contiguous sizable subgaleal fluid collection continuing to the vertex. 3. Negative intracranial and cervical MRA. Electronically Signed   By: Marnee SpringJonathon  Watts M.D.   On: 12/15/2016 13:03      Current Discharge Medication List    START taking these medications   Details  aspirin EC 81 MG EC tablet Take 1 tablet (81 mg total) by mouth daily.  cephALEXin (KEFLEX) 500 MG capsule Take 1 capsule (500 mg total) by mouth every 8 (eight) hours. Qty: 18 capsule, Refills: 0    liver oil-zinc oxide (DESITIN) 40 % ointment Apply topically 2 (two) times daily. Qty: 56.7 g, Refills: 0    nicotine (NICODERM CQ - DOSED IN MG/24 HOURS) 14 mg/24hr patch  Place 1 patch (14 mg total) onto the skin daily. Qty: 28 patch, Refills: 0    silver sulfADIAZINE (SILVADENE) 1 % cream Apply topically 2 (two) times daily. Qty: 50 g, Refills: 0          Management plans discussed with the patient and he is in agreement. Stable for discharge home  Patient should follow up with [facility] CODE STATUS:     Code Status Orders        Start     Ordered   12/12/16 2237  Full code  Continuous     12/12/16 2236    Code Status History    Date Active Date Inactive Code Status Order ID Comments User Context   This patient has a current code status but no historical code status.      TOTAL TIME TAKING CARE OF THIS PATIENT: 38 minutes.    Note: This dictation was prepared with Dragon dictation along with smaller phrase technology. Any transcriptional errors that result from this process are unintentional.  Clydene Burack M.D on 12/16/2016 at 9:31 AM  Between 7am to 6pm - Pager - [phone] After 6pm go to [email or web address] - password [email or web address]  [facility]  Office  [phone]  CC: Primary care physician; [facility]

## 2016-12-14 NOTE — Progress Notes (Signed)
Notified Dr. Luberta MutterKonidena that MRI has gone home and someone would need to be called in. Per MD she will get in touch with Dr. MOdy to see if tech needs to be called in tonight.

## 2016-12-14 NOTE — Evaluation (Signed)
Physical Therapy Evaluation Patient Details Name: Donald Becker MRN: [id number] DOB: 09/29/1980 Today's Date: 12/14/2016   History of Present Illness  36 yo male with onset of fall with rhabdomyolysis, dehydrated, AKI with metabolic encephalopathy, EtOH abuse.  Works in [employer] job, has been I for all mobility and daily activities.  +  Clinical Impression  Pt is up to walk with PT using IV pole with assistance with strong R side list.  He is not going to qualify for home therapy and [facility], with his follow through on paper work.  He is recommended to use RW and safety instructions will be given to family for home.  Follow acutely for strength and safety regarding gait and transfers.    Follow Up Recommendations Home health PT;Supervision for mobility/OOB    Equipment Recommendations  Rolling walker with 5" wheels    Recommendations for Other Services       Precautions / Restrictions Precautions Precautions: Fall Restrictions Weight Bearing Restrictions: No      Mobility  Bed Mobility Overal bed mobility: Needs Assistance Bed Mobility: Supine to Sit;Rolling Rolling: Supervision   Supine to sit: Supervision     General bed mobility comments: moves with some lack of awareness of his safety, tries to stand with postural shift  Transfers Overall transfer level: Needs assistance Equipment used: 1 person hand held assist Transfers: Sit to/from Stand;Stand Pivot Transfers Sit to Stand: Min guard Stand pivot transfers: Min assist       General transfer comment: contact needed to control shifts to the right  Ambulation/Gait Ambulation/Gait assistance: Min guard;Min assist Ambulation Distance (Feet): 300 Feet Assistive device: 1 person hand held assist (IV pole) Gait Pattern/deviations: Step-through pattern;Wide base of support;Trunk flexed;Decreased stride length;Drifts right/left Gait velocity: reduced Gait velocity  interpretation: Below normal speed for age/gender General Gait Details: listing to the right with all gait, turns to L throw off to R as well  Stairs            Wheelchair Mobility    Modified Rankin (Stroke Patients Only)       Balance Overall balance assessment: History of Falls;Needs assistance Sitting-balance support: Feet supported Sitting balance-Leahy Scale: Fair   Postural control: Posterior lean Standing balance support: Bilateral upper extremity supported Standing balance-Leahy Scale: Poor                               Pertinent Vitals/Pain Pain Assessment: No/denies pain    Home Living Family/patient expects to be discharged to:: Private residence Living Arrangements: Spouse/significant other Available Help at Discharge: Family;Available 24 hours/day Type of Home: House Home Access: Stairs to enter Entrance Stairs-Rails: None Entrance Stairs-Number of Steps: 2 Home Layout: One level Home Equipment: None      Prior Function Level of Independence: Independent               Hand Dominance        Extremity/Trunk Assessment   Upper Extremity Assessment Upper Extremity Assessment: Generalized weakness    Lower Extremity Assessment Lower Extremity Assessment: Generalized weakness    Cervical / Trunk Assessment Cervical / Trunk Assessment: Other exceptions (neck stiffness with poor rotation of head)  Communication   Communication: No difficulties  Cognition Arousal/Alertness: Lethargic Behavior During Therapy: WFL for tasks assessed/performed Overall Cognitive Status: Impaired/Different from baseline Area of Impairment: Attention;Following commands;Safety/judgement;Awareness;Problem solving  Current Attention Level: Selective   Following Commands: Follows one step commands with increased time Safety/Judgement: Decreased awareness of safety;Decreased awareness of deficits Awareness:  Intellectual Problem Solving: Slow processing;Requires verbal cues General Comments: unsafe transfers bedside, help to maintain balance esp with turns even using IV pole to s teady      General Comments      Exercises     Assessment/Plan    PT Assessment Patient needs continued PT services  PT Problem List Decreased strength;Decreased range of motion;Decreased activity tolerance;Decreased balance;Decreased mobility;Decreased cognition;Decreased coordination;Decreased knowledge of use of DME;Decreased safety awareness;Cardiopulmonary status limiting activity;Obesity;Decreased skin integrity       PT Treatment Interventions DME instruction;Gait training;Functional mobility training;Therapeutic activities;Therapeutic exercise;Balance training;Neuromuscular re-education;Patient/family education    PT Goals (Current goals can be found in the Care Plan section)  Acute Rehab PT Goals Patient Stated Goal: to get his walk done and get home PT Goal Formulation: With patient/family Time For Goal Achievement: 12/28/16 Potential to Achieve Goals: Good    Frequency Min 2X/week   Barriers to discharge Inaccessible home environment;Decreased caregiver support      Co-evaluation               AM-PAC PT "6 Clicks" Daily Activity  Outcome Measure Difficulty turning over in bed (including adjusting bedclothes, sheets and blankets)?: A Little Difficulty moving from lying on back to sitting on the side of the bed? : A Little Difficulty sitting down on and standing up from a chair with arms (e.g., wheelchair, bedside commode, etc,.)?: A Little Help needed moving to and from a bed to chair (including a wheelchair)?: A Little Help needed walking in hospital room?: A Little Help needed climbing 3-5 steps with a railing? : A Lot 6 Click Score: 17    End of Session Equipment Utilized During Treatment: Gait belt Activity Tolerance: Patient limited by fatigue;Patient limited by  lethargy;Treatment limited secondary to medical complications (Comment) Patient left: in bed;with call bell/phone within reach;with bed alarm set;with family/visitor present Nurse Communication: Mobility status PT Visit Diagnosis: History of falling (Z91.81);Muscle weakness (generalized) (M62.81);Unsteadiness on feet (R26.81)    Time: 1610-9604 PT Time Calculation (min) (ACUTE ONLY): 32 min   Charges:   PT Evaluation $PT Eval Moderate Complexity: 1 Procedure PT Treatments $Gait Training: 8-22 mins   PT G Codes:   PT G-Codes **NOT FOR INPATIENT CLASS** Functional Assessment Tool Used: AM-PAC 6 Clicks Basic Mobility    Ivar Drape 12/14/2016, 2:56 PM   Samul Dada, PT MS Acute Rehab Dept. Number: [facility] [id number] and [facility] [phone]

## 2016-12-15 ENCOUNTER — Inpatient Hospital Stay
Admit: 2016-12-15 | Discharge: 2016-12-15 | Disposition: A | Discharge status: Home / Self Care | Payer: Self-pay | Attending: Internal Medicine | Admitting: Internal Medicine

## 2016-12-15 ENCOUNTER — Inpatient Hospital Stay: Payer: Self-pay

## 2016-12-15 DIAGNOSIS — R4182 Altered mental status, unspecified: Secondary | ICD-10-CM

## 2016-12-15 DIAGNOSIS — L89819 Pressure ulcer of head, unspecified stage: Secondary | ICD-10-CM

## 2016-12-15 DIAGNOSIS — I639 Cerebral infarction, unspecified: Secondary | ICD-10-CM

## 2016-12-15 DIAGNOSIS — N179 Acute kidney failure, unspecified: Principal | ICD-10-CM

## 2016-12-15 LAB — HEPATITIS PANEL, ACUTE
HCV Ab: >11 s/co ratio — ABNORMAL HIGH (ref 0.0–0.9)
Hep A IgM: NEGATIVE
Hep B C IgM: NEGATIVE
Hepatitis B Surface Ag: NEGATIVE

## 2016-12-15 LAB — ECHOCARDIOGRAM COMPLETE
HEIGHTINCHES: 71 in
Weight: 4182.4 oz

## 2016-12-15 LAB — CK: Total CK: 7062 U/L — ABNORMAL HIGH (ref 49–397)

## 2016-12-15 LAB — LIPID PANEL
CHOL/HDL RATIO: 6.7 ratio
Cholesterol: 140 mg/dL (ref 0–200)
HDL: 21 mg/dL — AB (ref >40)
LDL CALC: 67 mg/dL (ref 0–99)
Triglycerides: 259 mg/dL — ABNORMAL HIGH (ref <150)
VLDL: 52 mg/dL — ABNORMAL HIGH (ref 0–40)

## 2016-12-15 MED ORDER — GADOBENATE DIMEGLUMINE 529 MG/ML IV SOLN
20.0000 mL | Freq: Once | INTRAVENOUS | Status: AC | PRN
  Administered 2016-12-15: 20 mL via INTRAVENOUS

## 2016-12-15 MED ORDER — CEPHALEXIN 500 MG PO CAPS
500.0000 mg | ORAL_CAPSULE | Freq: Three times a day (TID) | ORAL | Status: DC
  Administered 2016-12-15 – 2016-12-16 (×3): 500 mg via ORAL
  Filled 2016-12-15 (×3): qty 1

## 2016-12-15 MED ORDER — SENNOSIDES-DOCUSATE SODIUM 8.6-50 MG PO TABS
1.0000 | ORAL_TABLET | Freq: Two times a day (BID) | ORAL | Status: DC
  Administered 2016-12-15 – 2016-12-16 (×2): 1 via ORAL
  Filled 2016-12-15: qty 1

## 2016-12-15 MED ORDER — NICOTINE 14 MG/24HR TD PT24
14.0000 mg | MEDICATED_PATCH | Freq: Every day | TRANSDERMAL | Status: DC
  Administered 2016-12-15 – 2016-12-16 (×2): 14 mg via TRANSDERMAL
  Filled 2016-12-15 (×2): qty 1

## 2016-12-15 MED ORDER — MORPHINE SULFATE (PF) 2 MG/ML IV SOLN
2.0000 mg | Freq: Once | INTRAVENOUS | Status: AC
  Administered 2016-12-15: 2 mg via INTRAVENOUS
  Filled 2016-12-15: qty 1

## 2016-12-15 NOTE — Progress Notes (Signed)
*  PRELIMINARY RESULTS* Echocardiogram 2D Echocardiogram has been performed.  Cristela BlueHege, Levoy Geisen 12/15/2016, 8:31 AM

## 2016-12-15 NOTE — Consult Note (Signed)
Referring Physician: Mody    Chief Complaint: Episode of unresponsiveness  HPI: Donald Becker is an 36 y.o. male who presents to the emergency department on 5/15 because of concerns for continued altered mental status. The wife states that he was last seen normal 3 mornings prior to presentation. 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 After coming home he had a sixpack of beer. Wife states that he passed out in his [place]. She states it was not running. The next morning the family was having a hard time waking him. He did spend the night in the [place]. Eventually they were able to get him on the ground and they tried calling him off with a hose and water. They then brought him into the garage where they kept fans on him. The family continue to watch the patient and when he did not regain his baseline mental status, he was brought in for evaluation.  Patient has not regained mental status and is asking the same questions over and over.     Date last known well: Date: 12/09/2016 Time last known well: Unable to determine tPA Given: No: Outside time window  History reviewed. No pertinent past medical history.  History reviewed. No pertinent surgical history.  No family history on file.   Social History:  reports that he has been smoking Cigarettes.  He has never used smokeless tobacco. He reports that he drinks alcohol. He reports that he does not use drugs.  Allergies: No Known Allergies  Medications:  I have reviewed the patient's current medications. Prior to Admission:  No prescriptions prior to admission.   Scheduled: . aspirin EC  81 mg Oral Daily  . cephALEXin  500 mg Oral Q8H  . liver oil-zinc oxide   Topical BID  . senna-docusate  1 tablet Oral BID  . silver sulfADIAZINE   Topical BID    ROS: History obtained from the patient  General ROS: negative for - chills, fatigue, fever, night sweats, weight gain or weight loss Psychological ROS: negative for -  behavioral disorder, hallucinations, memory difficulties, mood swings or suicidal ideation Ophthalmic ROS: negative for - blurry vision, double vision, eye pain or loss of vision ENT ROS: negative for - epistaxis, nasal discharge, oral lesions, sore throat, tinnitus or vertigo Allergy and Immunology ROS: negative for - hives or itchy/watery eyes Hematological and Lymphatic ROS: negative for - bleeding problems, bruising or swollen lymph nodes Endocrine ROS: negative for - galactorrhea, hair pattern changes, polydipsia/polyuria or temperature intolerance Respiratory ROS: negative for - cough, hemoptysis, shortness of breath or wheezing Cardiovascular ROS: negative for - chest pain, dyspnea on exertion, edema or irregular heartbeat Gastrointestinal ROS: negative for - abdominal pain, diarrhea, hematemesis, nausea/vomiting or stool incontinence Genito-Urinary ROS: negative for - dysuria, hematuria, incontinence or urinary frequency/urgency Musculoskeletal ROS: neck pain and back pain Neurological ROS: as noted in HPI Dermatological ROS: negative for rash and skin lesion changes  Physical Examination: Blood pressure 116/62, pulse 65, temperature 97.9 F (36.6 C), temperature source Oral, resp. rate 20, height 5\' 11"  (1.803 m), weight 118.6 kg (261 lb 6.4 oz), SpO2 98 %.  HEENT-  Normocephalic, no lesions, without obvious abnormality.  Normal external eye and conjunctiva.  Normal TM's bilaterally.  Normal auditory canals and external ears. Normal external nose, mucus membranes and septum.  Normal pharynx. Cardiovascular- S1, S2 normal, pulses palpable throughout   Lungs- chest clear, no wheezing, rales, normal symmetric air entry Abdomen- soft, non-tender; bowel sounds normal; no masses,  no organomegaly Extremities- no edema Lymph-no adenopathy palpable Musculoskeletal-neck dressing Skin-warm and dry, no hyperpigmentation, vitiligo, or suspicious lesions  Neurological Examination   Mental  Status: Alert.  Oriented.  Speech nonfluent.  Able to follow 3 step commands without difficulty. Cranial Nerves: II: Discs flat bilaterally; Visual fields grossly normal, pupils equal, round, reactive to light and accommodation III,IV, VI: ptosis not present, extra-ocular motions intact bilaterally V,VII: smile symmetric, facial light touch sensation normal bilaterally VIII: hearing normal bilaterally IX,X: gag reflex present XI: bilateral shoulder shrug XII: midline tongue extension Motor: Right : Upper extremity   5/5    Left:     Upper extremity   5/5  Lower extremity   5/5     Lower extremity   5/5 Tone and bulk:normal tone throughout; no atrophy noted Sensory: Pinprick and light touch intact throughout, bilaterally Deep Tendon Reflexes: 2+ throughout Plantars: Right: mute   Left: mute Cerebellar: Normal finger-to-nose and normal heel-to-shin testing bilaterally Gait: not tested due to safety concerns    Laboratory Studies:  Basic Metabolic Panel:  Recent Labs Lab 12/12/16 1722 12/12/16 2314 12/13/16 0446 12/14/16 0419  NA 125*  --  131* 135  K 4.5  --  3.9 3.9  CL 91*  --  101 105  CO2 20*  --  22 24  GLUCOSE 124*  --  101* 114*  BUN 96*  --  86* 53*  CREATININE 4.16*  --  3.36* 1.91*  CALCIUM 8.3*  --  7.8* 8.0*  MG  --  2.4  --   --   PHOS 1.4* 5.3*  --   --     Liver Function Tests:  Recent Labs Lab 12/12/16 1722 12/13/16 0446 12/14/16 0419  AST 682* 499* 314*  ALT 363* 319* 257*  ALKPHOS 75 63 67  BILITOT 1.0 0.8 0.6  PROT 7.5 6.4* 6.1*  ALBUMIN 3.6 3.1* 2.9*   No results for input(s): LIPASE, AMYLASE in the last 168 hours.  Recent Labs Lab 12/12/16 1856  AMMONIA 11    CBC:  Recent Labs Lab 12/12/16 1722 12/13/16 0446  WBC 20.9* 15.0*  HGB 16.0 14.1  HCT 45.6 40.1  MCV 89.6 89.7  PLT 353 177    Cardiac Enzymes:  Recent Labs Lab 12/12/16 1856 12/13/16 0446 12/14/16 0419 12/14/16 1557 12/15/16 0928  CKTOTAL 43,569* 38,497*  14,240* 12,065* 7,062*    BNP: Invalid input(s): POCBNP  CBG:  Recent Labs Lab 12/13/16 1637  GLUCAP 128*    Microbiology: No results found for this or any previous visit.  Coagulation Studies:  Recent Labs  12/12/16 1856  LABPROT 13.0  INR 0.98    Urinalysis:  Recent Labs Lab 12/12/16 1722  COLORURINE YELLOW*  LABSPEC 1.013  PHURINE 5.0  GLUCOSEU 50*  HGBUR LARGE*  BILIRUBINUR NEGATIVE  KETONESUR NEGATIVE  PROTEINUR 30*  NITRITE NEGATIVE  LEUKOCYTESUR NEGATIVE    Lipid Panel:    Component Value Date/Time   CHOL 140 12/15/2016 0507   TRIG 259 (H) 12/15/2016 0507   HDL 21 (L) 12/15/2016 0507   CHOLHDL 6.7 12/15/2016 0507   VLDL 52 (H) 12/15/2016 0507   LDLCALC 67 12/15/2016 0507    HgbA1C: No results found for: HGBA1C  Urine Drug Screen:     Component Value Date/Time   LABOPIA NONE DETECTED 12/12/2016 1720   COCAINSCRNUR NONE DETECTED 12/12/2016 1720   LABBENZ POSITIVE (A) 12/12/2016 1720   AMPHETMU NONE DETECTED 12/12/2016 1720   THCU NONE DETECTED 12/12/2016 1720  LABBARB NONE DETECTED 12/12/2016 1720    Alcohol Level:  Recent Labs Lab 12/12/16 1722  ETH <5    Other results: EKG: sinus rhythm at 80 bpm.  Imaging: Ct Head Wo Contrast  Result Date: 12/14/2016 CLINICAL DATA:  Altered mental status. EXAM: CT HEAD WITHOUT CONTRAST TECHNIQUE: Contiguous axial images were obtained from the base of the skull through the vertex without intravenous contrast. COMPARISON:  None. 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 Bilateral basal ganglia lacunar infarcts also noted, left more pronounced than right. These are age indeterminate. No hemorrhage or hydrocephalus. Vascular: No hyperdense vessel or unexpected calcification. Skull: No acute calvarial abnormality. Sinuses/Orbits: Visualized paranasal sinuses and mastoids clear. Orbital soft tissues unremarkable. Other: Soft tissue  swelling/ low-density fluid noted in the posterior scalp. IMPRESSION: Areas of age-indeterminate infarction and both occipital lobes and bilateral basal ganglia. Electronically Signed   By: Charlett Nose M.D.   On: 12/14/2016 15:38    Assessment: 36 y.o. male presenting after a prolonged period of unresponsiveness.  Etiology unclear.  Patient has not regained mental status.  Head CT reviewed and shows areas of hypodensity in the bilateral occipital lobes and BG bilaterally.  Although this may be secondary to hypoxia, can not rule out an embolic shower as well.  UDS only significant for benzos.  MRI, MRA pending.  Echocardiogram shows no cardiac source of emboli, EF of 65%.  Further work up recommended.    Stroke Risk Factors - smoking  Plan: 1. HgbA1c, fasting lipid panel 2. PT consult, OT consult, Speech consult 3. Prophylactic therapy-Antiplatelet med: Aspirin - dose 325mg  daily 4. Telemetry monitoring 5. Frequent neuro checks 6. EEG    Thana Farr, MD Neurology [phone] 12/15/2016, 12:18 PM

## 2016-12-15 NOTE — Consult Note (Signed)
WOC Nurse wound follow up  Reason for Consult:Heat stroke, thermal injury to neck (consistent with sunburn).  Patient and wife feel this is more related to pressure and lying on a piece of metal instead of temperature.  Blisters are now dry and scabbed.  Have been applying Silvadene cream but patient states it has been covered some of the time and open other times.  Area is indurated, warm and tender to touch, consistent with cellulitis to this area. MD to request surgical consult.  Erythema to right posterior flank and bilateral buttocks, consistent with moisture associated skin damage.Wound type:thermal injury, moisture associated skin damage Pressure Injury POA: N/A Measurement: posterior and right lateral neck:  4 cm x 8 cm with scattered dry, cracked blisters Right flank:  3 cm x 3 cm maroon discoloration Bilateral buttocks in gluteal fold:  4 cm x 2 cm x 0.1 cm each side Wound bed:dry and scabbed Drainage (amount, consistency, odor) scant serous  No odor Periwound:blanchable erythema Dressing procedure/placement/frequency:Cleanse neck with soap and water.  Apply Silvadene cream, cover with nonadherent telfa, Gauze and tape.  Change twice daily. Cleanse right flank and buttocks with soap and water.Apply Boudreaux's butt paste to erythema twice daily.  Will not follow at this time.  Please re-consult if needed.  Maple HudsonKaren Jenipher Havel RN BSN CWON Pager [phone]

## 2016-12-15 NOTE — Progress Notes (Signed)
[facility] [place] [facility]   PATIENT NAME: Donald Becker    MR#:  [id number]  DATE OF BIRTH:  01/26/1981  SUBJECTIVE:   Patient c/o pain on neck Fiance at bedside  She reports that a few days prior to leaving him into the hospital they now realizes that he was having some difficulty walking.  REVIEW OF SYSTEMS:      Review of Systems  Constitutional: Negative for fever, chills weight loss HENT: Negative for ear pain, nosebleeds, congestion, facial swelling, rhinorrhea,  Patient having neck pain with small indurated area in the neck and erythema   Respiratory: Negative for cough, shortness of breath, wheezing  Cardiovascular: Negative for chest pain, and leg swelling.  Palpitations when ambulating Gastrointestinal: Negative for heartburn, abdominal pain, vomiting, diarrhea or consitpation Genitourinary: Negative for dysuria, urgency, frequency, hematuria Musculoskeletal: Negative for back pain or joint pain Neurological: Negative for dizziness, seizures, syncope, focal weakness,  numbness and headaches.  Gait is wobbly Hematological: Does not bruise/bleed easily.  Psychiatric/Behavioral: Negative for hallucinations, confusion, dysphoric mood  Tolerating Diet:yes      DRUG ALLERGIES:  No Known Allergies  VITALS:  Blood pressure 116/62, pulse 65, temperature 97.9 F (36.6 C), temperature source Oral, resp. rate 20, height 5\' 11"  (1.803 m), weight 118.6 kg (261 lb 6.4 oz), SpO2 98 %.  PHYSICAL EXAMINATION:  Constitutional: Appears well-developed and well-nourished. No distress. HENT: Normocephalic. . Eyes: Conjunctivae and EOM are normal. PERRLA, no scleral icterus.  Neck:No JVD. No tracheal deviation. CVS: RRR, S1/S2 +, no murmurs, no gallops, no carotid bruit.  Pulmonary: Effort and breath sounds normal, no stridor, rhonchi, wheezes, rales.  Abdominal: Soft. BS +,  no distension, tenderness, rebound or guarding.  Musculoskeletal: Normal range  of motion. No edema and no tenderness.  Neuro: CN 2-12 intact Skin: swelling back of neck with One hard indurated area and erythema  Psychiatric:normal mood  LABORATORY PANEL:   CBC  Recent Labs Lab 12/13/16 0446  WBC 15.0*  HGB 14.1  HCT 40.1  PLT 177   ------------------------------------------------------------------------------------------------------------------  Chemistries   Recent Labs Lab 12/12/16 2314  12/14/16 0419  NA  --   < > 135  K  --   < > 3.9  CL  --   < > 105  CO2  --   < > 24  GLUCOSE  --   < > 114*  BUN  --   < > 53*  CREATININE  --   < > 1.91*  CALCIUM  --   < > 8.0*  MG 2.4  --   --   AST  --   < > 314*  ALT  --   < > 257*  ALKPHOS  --   < > 67  BILITOT  --   < > 0.6  < > = values in this interval not displayed. ------------------------------------------------------------------------------------------------------------------  Cardiac Enzymes No results for input(s): TROPONINI in the last 168 hours. ------------------------------------------------------------------------------------------------------------------  RADIOLOGY:  Ct Head Wo Contrast  Result Date: 12/14/2016 CLINICAL DATA:  Altered mental status. EXAM: CT HEAD WITHOUT CONTRAST TECHNIQUE: Contiguous axial images were obtained from the base of the skull through the vertex without intravenous contrast. COMPARISON:  None. 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 Bilateral basal ganglia lacunar infarcts also noted, left more pronounced than right. These are age indeterminate. No hemorrhage or hydrocephalus. Vascular: No hyperdense vessel or unexpected calcification. Skull: No acute calvarial abnormality.  Sinuses/Orbits: Visualized paranasal sinuses and mastoids clear. Orbital soft tissues unremarkable. Other: Soft tissue swelling/ low-density fluid noted in the posterior scalp. IMPRESSION: Areas of age-indeterminate  infarction and both occipital lobes and bilateral basal ganglia. Electronically Signed   By: Charlett Nose M.D.   On: 12/14/2016 15:38     ASSESSMENT AND PLAN:   36 year old male with no past medical history who is admitted with acute encephalopathy and acute rhabdomyolysis.  1. Acute metabolic encephalopathy in setting of acute kidney injury and acute rhabdomyolysis And possible subacute CVA  Mental status is improving Follow-up an MRI/MRA, echocardiogram and carotid Doppler Case discussed with Dr. Thad Ranger yesterday neurology consultation pending for a.m. PT consult placed  2. Acute rhabdo is due to heat exhaustion: Continue aggressive hydration. Continue to monitor BMP CK trending down  3. Acute kidney injury in the setting of dehydration and rhabdo Continue aggressive hydration and repeat BMP in a.m. Creatinine showing improvement  4. Transaminitis: This is due to dehydration  AST and ALT are improving 5. Hyponatremia due to dehydration: This is improved with IV fluids 6. Leukocytosis purely due to dehydration which is improving with IV fluids  7. Neck Cellulitis: Start Keflex and surgery evaluation for possible I&D  Management plans discussed with the patient's family and they are in agreement.  CODE STATUS: full  TOTAL TIME TAKING CARE OF THIS PATIENT: 30 minutes.     POSSIBLE D/C 2 days, DEPENDING ON CLINICAL CONDITION.   Halsey Hammen M.D on 12/15/2016 at 9:34 AM  Between 7am to 6pm - Pager - [phone] After 6pm go to [email or web address] - password [email or web address]  [facility] [facility]  Office  [phone]  CC: Primary care physician; Patient, No Pcp Per  Note: This dictation was prepared with Dragon dictation along with smaller phrase technology. Any transcriptional errors that result from this process are unintentional.

## 2016-12-15 NOTE — Consult Note (Signed)
Surgical Consultation  12/15/2016  Donald Becker is an 36 y.o. male.   CC: Neck pain  HPI: This a patient admitted the hospital several days ago after being found down in his [name] with his neck back arm and leg resting against the door for several hours. He was experiencing altered mental status at that time and was brought to the hospital with suggestion that he may have had a stroke. Today he was complaining of some neck pain and I was asked see the patient for that neck pain. He treated for rhabdomyolysis.  History reviewed. No pertinent past medical history.  History reviewed. No pertinent surgical history.  No family history on file.  Social History:  reports that he has been smoking Cigarettes.  He has never used smokeless tobacco. He reports that he drinks alcohol. He reports that he does not use drugs.  Allergies: No Known Allergies  Medications reviewed.   Review of Systems:   Review of Systems  Unable to perform ROS: Mental acuity  Unable to perform ROS as patient does not answer many questions and his significant other answers for him mostly. Physical Exam:  BP 116/62 (BP Location: Right Arm)   Pulse 65   Temp 97.9 F (36.6 C) (Oral)   Resp 20   Ht '5\' 11"'$  (1.803 m)   Wt 261 lb 6.4 oz (118.6 kg)   SpO2 98%   BMI 36.46 kg/m   Physical Exam  Constitutional: He is well-developed, well-nourished, and in no distress.  Answers questions but only minimally and not completely verbalizes. Voice is not slurred  HENT:  Head: Normocephalic and atraumatic.  Eyes: Pupils are equal, round, and reactive to light. Right eye exhibits no discharge. Left eye exhibits no discharge. No scleral icterus.  Neck:  Reading this and redness to the neck on the right side and posterior area with some very minimal blistering without tenderness or fluctuance. There is considerable induration  Pulmonary/Chest: Effort normal.  Right posterior chest with erythema and induration in a patchy  area measuring approximately 12 x 20 cm  Abdominal: Soft. He exhibits no distension. There is no tenderness.  Musculoskeletal:  Similar redness in a patchy area of the right buttock with induration but no tenderness  Skin: He is not diaphoretic.  Vitals reviewed.     Results for orders placed or performed during the hospital encounter of 12/12/16 (from the past 48 hour(s))  Glucose, capillary     Status: Abnormal   Collection Time: 12/13/16  4:37 PM  Result Value Ref Range   Glucose-Capillary 128 (H) 65 - 99 mg/dL  Basic metabolic panel     Status: Abnormal   Collection Time: 12/14/16  4:19 AM  Result Value Ref Range   Sodium 135 135 - 145 mmol/L   Potassium 3.9 3.5 - 5.1 mmol/L    Comment: HEMOLYSIS AT THIS LEVEL MAY AFFECT RESULT   Chloride 105 101 - 111 mmol/L   CO2 24 22 - 32 mmol/L   Glucose, Bld 114 (H) 65 - 99 mg/dL   BUN 53 (H) 6 - 20 mg/dL   Creatinine, Ser 1.91 (H) 0.61 - 1.24 mg/dL   Calcium 8.0 (L) 8.9 - 10.3 mg/dL   GFR calc non Af Amer 44 (L) >60 mL/min   GFR calc Af Amer 51 (L) >60 mL/min    Comment: (NOTE) The eGFR has been calculated using the CKD EPI equation. This calculation has not been validated in all clinical situations. eGFR's persistently <60 mL/min signify  possible Chronic Kidney Disease.    Anion gap 6 5 - 15  CK     Status: Abnormal   Collection Time: 12/14/16  4:19 AM  Result Value Ref Range   Total CK 14,240 (H) 49 - 397 U/L    Comment: RESULT CONFIRMED BY MANUAL DILUTION [facility]   Hepatic function panel     Status: Abnormal   Collection Time: 12/14/16  4:19 AM  Result Value Ref Range   Total Protein 6.1 (L) 6.5 - 8.1 g/dL   Albumin 2.9 (L) 3.5 - 5.0 g/dL   AST 314 (H) 15 - 41 U/L   ALT 257 (H) 17 - 63 U/L   Alkaline Phosphatase 67 38 - 126 U/L   Total Bilirubin 0.6 0.3 - 1.2 mg/dL   Bilirubin, Direct 0.1 0.1 - 0.5 mg/dL   Indirect Bilirubin 0.5 0.3 - 0.9 mg/dL  CK     Status: Abnormal   Collection Time: 12/14/16  3:57 PM  Result  Value Ref Range   Total CK 12,065 (H) 49 - 397 U/L    Comment: RESULT CONFIRMED BY MANUAL DILUTION.  TFK  Lipid panel     Status: Abnormal   Collection Time: 12/15/16  5:07 AM  Result Value Ref Range   Cholesterol 140 0 - 200 mg/dL   Triglycerides 259 (H) <150 mg/dL   HDL 21 (L) >40 mg/dL   Total CHOL/HDL Ratio 6.7 RATIO   VLDL 52 (H) 0 - 40 mg/dL   LDL Cholesterol 67 0 - 99 mg/dL    Comment:        Total Cholesterol/HDL:CHD Risk Coronary Heart Disease Risk Table                     Men   Women  1/2 Average Risk   3.4   3.3  Average Risk       5.0   4.4  2 X Average Risk   9.6   7.1  3 X Average Risk  23.4   11.0        Use the calculated Patient Ratio above and the CHD Risk Table to determine the patient's CHD Risk.        ATP III CLASSIFICATION (LDL):  <100     mg/dL   Optimal  100-129  mg/dL   Near or Above                    Optimal  130-159  mg/dL   Borderline  160-189  mg/dL   High  >190     mg/dL   Very High    Ct Head Wo Contrast  Result Date: 12/14/2016 CLINICAL DATA:  Altered mental status. EXAM: CT HEAD WITHOUT CONTRAST TECHNIQUE: Contiguous axial images were obtained from the base of the skull through the vertex without intravenous contrast. COMPARISON:  None. 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 Bilateral basal ganglia lacunar infarcts also noted, left more pronounced than right. These are age indeterminate. No hemorrhage or hydrocephalus. Vascular: No hyperdense vessel or unexpected calcification. Skull: No acute calvarial abnormality. Sinuses/Orbits: Visualized paranasal sinuses and mastoids clear. Orbital soft tissues unremarkable. Other: Soft tissue swelling/ low-density fluid noted in the posterior scalp. IMPRESSION: Areas of age-indeterminate infarction and both occipital lobes and bilateral basal ganglia. Electronically Signed   By: Rolm Baptise M.D.   On: 12/14/2016 15:38     Assessment/Plan:  This patient was moved the  hospital with signs of rhabdomyolysis after being found down. His significant other suggestion that she found him in the [place] with his head neck leaning on a metal handle, and his back against the door. And that he was sitting somewhat on his right side. This is consistent with his physical exam of right buttock induration right posterior chest induration and right neck induration. I do not feel that this is infected but there are some areas of minimal blistering posteriorly and antibiotics might be added in this situation but it certainly warrants 3 observation and re-examination should this worsen drainage could be performed if an abscess were to occur however at this time I do not see signs of an abscess and I suspect that all 3 areas are similar in the source of his rhabdo.  Florene Glen, MD, FACS

## 2016-12-16 DIAGNOSIS — G931 Anoxic brain damage, not elsewhere classified: Secondary | ICD-10-CM

## 2016-12-16 LAB — HEMOGLOBIN A1C
Hgb A1c MFr Bld: 5.6 % (ref 4.8–5.6)
MEAN PLASMA GLUCOSE: 114 mg/dL

## 2016-12-16 LAB — LIPID PANEL
CHOL/HDL RATIO: 6.9 ratio
Cholesterol: 137 mg/dL (ref 0–200)
HDL: 20 mg/dL — AB (ref >40)
LDL CALC: 66 mg/dL (ref 0–99)
TRIGLYCERIDES: 253 mg/dL — AB (ref <150)
VLDL: 51 mg/dL — AB (ref 0–40)

## 2016-12-16 LAB — BASIC METABOLIC PANEL
ANION GAP: 6 (ref 5–15)
BUN: 20 mg/dL (ref 6–20)
CO2: 29 mmol/L (ref 22–32)
Calcium: 8.5 mg/dL — ABNORMAL LOW (ref 8.9–10.3)
Chloride: 105 mmol/L (ref 101–111)
Creatinine, Ser: 1.3 mg/dL — ABNORMAL HIGH (ref 0.61–1.24)
GFR calc Af Amer: >60 mL/min (ref >60)
GLUCOSE: 114 mg/dL — AB (ref 65–99)
POTASSIUM: 3.9 mmol/L (ref 3.5–5.1)
Sodium: 140 mmol/L (ref 135–145)

## 2016-12-16 LAB — CK: Total CK: 3441 U/L — ABNORMAL HIGH (ref 49–397)

## 2016-12-16 MED ORDER — NICOTINE 14 MG/24HR TD PT24: 14.0000 mg | MEDICATED_PATCH | Freq: Every day | TRANSDERMAL | 0 refills | Status: DC

## 2016-12-16 MED ORDER — CEPHALEXIN 500 MG PO CAPS: 500.0000 mg | ORAL_CAPSULE | Freq: Three times a day (TID) | ORAL | 0 refills | Status: AC

## 2016-12-16 MED ORDER — ASPIRIN 81 MG PO TBEC: 81.0000 mg | DELAYED_RELEASE_TABLET | Freq: Every day | ORAL | Status: DC

## 2016-12-16 NOTE — Care Management Note (Addendum)
Case Management Note  Patient Details  Name: Donald Becker MRN: [id number] Date of Birth: 09/09/1980  Subjective/Objective:       Discussed discharge planning with uninsured Donald Becker.  Obtained information for Becker financial Assessment and called the [facility]. Requested HH-PT, RN, OT, [facility] [place]  Donald Becker advised that he can obtain Silvadene cream for $4.00 and [facility]  [facility]. No other discharge needs identified.           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

## 2016-12-16 NOTE — Progress Notes (Signed)
Subjective: Patient more awake and alert today. Wife reports that hi memory continue to be an issue.  Objective: Current vital signs: BP 115/63   Pulse 72   Temp 97.7 F (36.5 C) (Oral)   Resp 16   Ht 5\' 11"  (1.803 m)   Wt 118.6 kg (261 lb 6.4 oz)   SpO2 96%   BMI 36.46 kg/m  Vital signs in last 24 hours: Temp:  [97.5 F (36.4 C)-98.3 F (36.8 C)] 97.7 F (36.5 C) (05/19 0605) Pulse Rate:  [65-72] 72 (05/19 0605) Resp:  [16-20] 16 (05/19 0605) BP: (115-134)/(63-87) 115/63 (05/19 0605) SpO2:  [96 %-99 %] 96 % (05/19 0605)  Intake/Output from previous day: 05/18 0701 - 05/19 0700 In: 4160 [P.O.:240; I.V.:3920] Out: -  Intake/Output this shift: Total I/O In: 240 [P.O.:240] Out: -  Nutritional status: Diet regular Room service appropriate? Yes; Fluid consistency: Thin Diet - low sodium heart healthy  Neurologic Exam: Mental Status: Alert.  Oriented.  Speech nonfluent.  Able to follow 3 step commands without difficulty. Cranial Nerves: II: Discs flat bilaterally; Visual fields grossly normal, pupils equal, round, reactive to light and accommodation III,IV, VI: ptosis not present, extra-ocular motions intact bilaterally V,VII: smile symmetric, facial light touch sensation normal bilaterally VIII: hearing normal bilaterally IX,X: gag reflex present XI: bilateral shoulder shrug XII: midline tongue extension Motor: Right :  Upper extremity   5/5                                      Left:     Upper extremity   5/5             Lower extremity   5/5                                                  Lower extremity   5/5 Tone and bulk:normal tone throughout; no atrophy noted Sensory: Pinprick and light touch intact throughout, bilaterally  Lab Results: Basic Metabolic Panel:  Recent Labs Lab 12/12/16 1722 12/12/16 2314 12/13/16 0446 12/14/16 0419 12/16/16 0412  NA 125*  --  131* 135 140  K 4.5  --  3.9 3.9 3.9  CL 91*  --  101 105 105  CO2 20*  --  22 24 29    GLUCOSE 124*  --  101* 114* 114*  BUN 96*  --  86* 53* 20  CREATININE 4.16*  --  3.36* 1.91* 1.30*  CALCIUM 8.3*  --  7.8* 8.0* 8.5*  MG  --  2.4  --   --   --   PHOS 1.4* 5.3*  --   --   --     Liver Function Tests:  Recent Labs Lab 12/12/16 1722 12/13/16 0446 12/14/16 0419  AST 682* 499* 314*  ALT 363* 319* 257*  ALKPHOS 75 63 67  BILITOT 1.0 0.8 0.6  PROT 7.5 6.4* 6.1*  ALBUMIN 3.6 3.1* 2.9*   No results for input(s): LIPASE, AMYLASE in the last 168 hours.  Recent Labs Lab 12/12/16 1856  AMMONIA 11    CBC:  Recent Labs Lab 12/12/16 1722 12/13/16 0446  WBC 20.9* 15.0*  HGB 16.0 14.1  HCT 45.6 40.1  MCV 89.6 89.7  PLT 353 177    Cardiac Enzymes:  Recent Labs Lab 12/13/16 0446 12/14/16 0419 12/14/16 1557 12/15/16 0928 12/16/16 0412  CKTOTAL 38,497* 14,240* 12,065* 7,062* 3,441*    Lipid Panel:  Recent Labs Lab 12/15/16 0507 12/16/16 0412  CHOL 140 137  TRIG 259* 253*  HDL 21* 20*  CHOLHDL 6.7 6.9  VLDL 52* 51*  LDLCALC 67 66    CBG:  Recent Labs Lab 12/13/16 1637  GLUCAP 128*    Microbiology: No results found for this or any previous visit.  Coagulation Studies: No results for input(s): LABPROT, INR in the last 72 hours.  Imaging: Mr Shirlee Latch ZO Contrast  Result Date: 12/15/2016 CLINICAL DATA:  Presented with altered mental status and rhabdomyolysis. Found unresponsive in [place]. EXAM: MRI HEAD WITHOUT AND WITH CONTRAST MRA HEAD WITHOUT CONTRAST MRA NECK WITHOUT AND WITH CONTRAST TECHNIQUE: Multiplanar, multiecho pulse sequences of the brain and surrounding structures were obtained without and with intravenous contrast. Angiographic images of the Circle of Willis were obtained using MRA technique without intravenous contrast. Angiographic images of the neck were obtained using MRA technique without and with intravenous contrast. Carotid stenosis measurements (when applicable) are obtained utilizing NASCET criteria, using the distal  internal carotid diameter as the denominator. CONTRAST:  20mL MULTIHANCE GADOBENATE DIMEGLUMINE 529 MG/ML IV SOLN COMPARISON:  Head CT from yesterday FINDINGS: MRI HEAD FINDINGS Brain: Symmetric restricted diffusion in the hippocampus, occipital cortex, globus pallidus, and superficial cerebellum. These are primarily metabolically active areas, and susceptible to hypoxic ischemic injury, hypoglycemia, or toxic insult -including carbon monoxide. No hemorrhage or hydrocephalus.  No masslike findings. Vascular: Arterial findings below. Normal dural venous sinus flow voids. Skull and upper cervical spine: The upper right neck musculature is T2 hyperintense, expanded, and nonenhancing - consistent with not myonecrosis and correlating with history of rhabdomyolysis. There is a large subgaleal scalp collection mainly posteriorly and right eccentric. Sinuses/Orbits: Mucosal thickening in the right mastoid air cells. Mild patchy mucosal thickening in the paranasal sinuses. MRA HEAD FINDINGS Standard intracranial arterial anatomy. Mild vertebrobasilar tortuosity. There is no stenosis, beading, or major branch occlusion. Negative for aneurysm. MRA NECK FINDINGS Time-of-flight imaging shows antegrade flow in both carotid and vertebral circulations. Postcontrast imaging does not cover the entire left vertebral artery, but the missed segment is normal on time-of-flight. Imaging is motion degraded, but there is no indication of stenosis or dissection. No beading noted. IMPRESSION: 1. Symmetric injury/infarct of the hippocampus, globus pallidus, cerebellum, and occipital cortex. This is the pattern of hypoxic ischemic injury or toxic/metabolic insult (hypoglycemia, carbon monoxide, ect). 2. Extensive necrosis of right posterior cervical musculature, correlating with history of rhabdomyolysis. Contiguous sizable subgaleal fluid collection continuing to the vertex. 3. Negative intracranial and cervical MRA. Electronically Signed    By: Marnee Spring M.D.   On: 12/15/2016 13:03   Mr Maxine Glenn Neck W Wo Contrast  Result Date: 12/15/2016 CLINICAL DATA:  Presented with altered mental status and rhabdomyolysis. Found unresponsive in [place]. EXAM: MRI HEAD WITHOUT AND WITH CONTRAST MRA HEAD WITHOUT CONTRAST MRA NECK WITHOUT AND WITH CONTRAST TECHNIQUE: Multiplanar, multiecho pulse sequences of the brain and surrounding structures were obtained without and with intravenous contrast. Angiographic images of the Circle of Willis were obtained using MRA technique without intravenous contrast. Angiographic images of the neck were obtained using MRA technique without and with intravenous contrast. Carotid stenosis measurements (when applicable) are obtained utilizing NASCET criteria, using the distal internal carotid diameter as the denominator. CONTRAST:  20mL MULTIHANCE GADOBENATE DIMEGLUMINE 529 MG/ML IV SOLN COMPARISON:  Head CT from  yesterday FINDINGS: MRI HEAD FINDINGS Brain: Symmetric restricted diffusion in the hippocampus, occipital cortex, globus pallidus, and superficial cerebellum. These are primarily metabolically active areas, and susceptible to hypoxic ischemic injury, hypoglycemia, or toxic insult -including carbon monoxide. No hemorrhage or hydrocephalus.  No masslike findings. Vascular: Arterial findings below. Normal dural venous sinus flow voids. Skull and upper cervical spine: The upper right neck musculature is T2 hyperintense, expanded, and nonenhancing - consistent with not myonecrosis and correlating with history of rhabdomyolysis. There is a large subgaleal scalp collection mainly posteriorly and right eccentric. Sinuses/Orbits: Mucosal thickening in the right mastoid air cells. Mild patchy mucosal thickening in the paranasal sinuses. MRA HEAD FINDINGS Standard intracranial arterial anatomy. Mild vertebrobasilar tortuosity. There is no stenosis, beading, or major branch occlusion. Negative for aneurysm. MRA NECK FINDINGS  Time-of-flight imaging shows antegrade flow in both carotid and vertebral circulations. Postcontrast imaging does not cover the entire left vertebral artery, but the missed segment is normal on time-of-flight. Imaging is motion degraded, but there is no indication of stenosis or dissection. No beading noted. IMPRESSION: 1. Symmetric injury/infarct of the hippocampus, globus pallidus, cerebellum, and occipital cortex. This is the pattern of hypoxic ischemic injury or toxic/metabolic insult (hypoglycemia, carbon monoxide, ect). 2. Extensive necrosis of right posterior cervical musculature, correlating with history of rhabdomyolysis. Contiguous sizable subgaleal fluid collection continuing to the vertex. 3. Negative intracranial and cervical MRA. Electronically Signed   By: Marnee SpringJonathon  Watts M.D.   On: 12/15/2016 13:03   Mr Laqueta JeanBrain W AOWo Contrast  Result Date: 12/15/2016 CLINICAL DATA:  Presented with altered mental status and rhabdomyolysis. Found unresponsive in [place]. EXAM: MRI HEAD WITHOUT AND WITH CONTRAST MRA HEAD WITHOUT CONTRAST MRA NECK WITHOUT AND WITH CONTRAST TECHNIQUE: Multiplanar, multiecho pulse sequences of the brain and surrounding structures were obtained without and with intravenous contrast. Angiographic images of the Circle of Willis were obtained using MRA technique without intravenous contrast. Angiographic images of the neck were obtained using MRA technique without and with intravenous contrast. Carotid stenosis measurements (when applicable) are obtained utilizing NASCET criteria, using the distal internal carotid diameter as the denominator. CONTRAST:  20mL MULTIHANCE GADOBENATE DIMEGLUMINE 529 MG/ML IV SOLN COMPARISON:  Head CT from yesterday FINDINGS: MRI HEAD FINDINGS Brain: Symmetric restricted diffusion in the hippocampus, occipital cortex, globus pallidus, and superficial cerebellum. These are primarily metabolically active areas, and susceptible to hypoxic ischemic injury, hypoglycemia,  or toxic insult -including carbon monoxide. No hemorrhage or hydrocephalus.  No masslike findings. Vascular: Arterial findings below. Normal dural venous sinus flow voids. Skull and upper cervical spine: The upper right neck musculature is T2 hyperintense, expanded, and nonenhancing - consistent with not myonecrosis and correlating with history of rhabdomyolysis. There is a large subgaleal scalp collection mainly posteriorly and right eccentric. Sinuses/Orbits: Mucosal thickening in the right mastoid air cells. Mild patchy mucosal thickening in the paranasal sinuses. MRA HEAD FINDINGS Standard intracranial arterial anatomy. Mild vertebrobasilar tortuosity. There is no stenosis, beading, or major branch occlusion. Negative for aneurysm. MRA NECK FINDINGS Time-of-flight imaging shows antegrade flow in both carotid and vertebral circulations. Postcontrast imaging does not cover the entire left vertebral artery, but the missed segment is normal on time-of-flight. Imaging is motion degraded, but there is no indication of stenosis or dissection. No beading noted. IMPRESSION: 1. Symmetric injury/infarct of the hippocampus, globus pallidus, cerebellum, and occipital cortex. This is the pattern of hypoxic ischemic injury or toxic/metabolic insult (hypoglycemia, carbon monoxide, ect). 2. Extensive necrosis of right posterior cervical musculature, correlating with history of  rhabdomyolysis. Contiguous sizable subgaleal fluid collection continuing to the vertex. 3. Negative intracranial and cervical MRA. Electronically Signed   By: Marnee Spring M.D.   On: 12/15/2016 13:03    Medications:  I have reviewed the patient's current medications. Scheduled: . aspirin EC  81 mg Oral Daily  . cephALEXin  500 mg Oral Q8H  . liver oil-zinc oxide   Topical BID  . nicotine  14 mg Transdermal Daily  . senna-docusate  1 tablet Oral BID  . silver sulfADIAZINE   Topical BID    Assessment/Plan: Patient more alert today,  MRI of  the brain reviewed and shows symmetric, what appears to be anoxic injury, in the bilateral GP, hippocampus, cerebellum and occipital cortex.  MRA was unremarkable.  LDL 66.  A1c of 5.6.  EEG unremarkable.   Results given to patient and family.  All questions addressed.  Recommendations: 1.  ASA daily to be continued.        LOS: 4 days   Thana Farr, MD Neurology [phone] 12/16/2016  3:53 PM

## 2016-12-16 NOTE — Progress Notes (Signed)
CC: Neck pain Subjective: Patient states his neck neck pain is better. He has pain on his right side and right buttock as well.  Objective: Vital signs in last 24 hours: Temp:  [97.5 F (36.4 C)-98.3 F (36.8 C)] 97.7 F (36.5 C) (05/19 0605) Pulse Rate:  [65-72] 72 (05/19 0605) Resp:  [16-20] 16 (05/19 0605) BP: (115-134)/(63-87) 115/63 (05/19 0605) SpO2:  [96 %-99 %] 96 % (05/19 0605) Last BM Date: 12/11/16  Intake/Output from previous day: 05/18 0701 - 05/19 0700 In: 4160 [P.O.:240; I.V.:3920] Out: -  Intake/Output this shift: Total I/O In: 240 [P.O.:240] Out: -   Physical exam:  Swelling of the neck right chest and right buttock unchanged. No purulence no obvious fluctuance or tenderness.  Lab Results: CBC  No results for input(s): WBC, HGB, HCT, PLT in the last 72 hours. BMET  Recent Labs  12/14/16 0419 12/16/16 0412  NA 135 140  K 3.9 3.9  CL 105 105  CO2 24 29  GLUCOSE 114* 114*  BUN 53* 20  CREATININE 1.91* 1.30*  CALCIUM 8.0* 8.5*   PT/INR No results for input(s): LABPROT, INR in the last 72 hours. ABG No results for input(s): PHART, HCO3 in the last 72 hours.  Invalid input(s): PCO2, PO2  Studies/Results: Ct Head Wo Contrast  Result Date: 12/14/2016 CLINICAL DATA:  Altered mental status. EXAM: CT HEAD WITHOUT CONTRAST TECHNIQUE: Contiguous axial images were obtained from the base of the skull through the vertex without intravenous contrast. COMPARISON:  None. 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 Bilateral basal ganglia lacunar infarcts also noted, left more pronounced than right. These are age indeterminate. No hemorrhage or hydrocephalus. Vascular: No hyperdense vessel or unexpected calcification. Skull: No acute calvarial abnormality. Sinuses/Orbits: Visualized paranasal sinuses and mastoids clear. Orbital soft tissues unremarkable. Other: Soft tissue swelling/ low-density  fluid noted in the posterior scalp. IMPRESSION: Areas of age-indeterminate infarction and both occipital lobes and bilateral basal ganglia. Electronically Signed   By: Charlett Nose M.D.   On: 12/14/2016 15:38   Mr Maxine Glenn Head Wo Contrast  Result Date: 12/15/2016 CLINICAL DATA:  Presented with altered mental status and rhabdomyolysis. Found unresponsive in [place]. EXAM: MRI HEAD WITHOUT AND WITH CONTRAST MRA HEAD WITHOUT CONTRAST MRA NECK WITHOUT AND WITH CONTRAST TECHNIQUE: Multiplanar, multiecho pulse sequences of the brain and surrounding structures were obtained without and with intravenous contrast. Angiographic images of the Circle of Willis were obtained using MRA technique without intravenous contrast. Angiographic images of the neck were obtained using MRA technique without and with intravenous contrast. Carotid stenosis measurements (when applicable) are obtained utilizing NASCET criteria, using the distal internal carotid diameter as the denominator. CONTRAST:  20mL MULTIHANCE GADOBENATE DIMEGLUMINE 529 MG/ML IV SOLN COMPARISON:  Head CT from yesterday FINDINGS: MRI HEAD FINDINGS Brain: Symmetric restricted diffusion in the hippocampus, occipital cortex, globus pallidus, and superficial cerebellum. These are primarily metabolically active areas, and susceptible to hypoxic ischemic injury, hypoglycemia, or toxic insult -including carbon monoxide. No hemorrhage or hydrocephalus.  No masslike findings. Vascular: Arterial findings below. Normal dural venous sinus flow voids. Skull and upper cervical spine: The upper right neck musculature is T2 hyperintense, expanded, and nonenhancing - consistent with not myonecrosis and correlating with history of rhabdomyolysis. There is a large subgaleal scalp collection mainly posteriorly and right eccentric. Sinuses/Orbits: Mucosal thickening in the right mastoid air cells. Mild patchy mucosal thickening in the paranasal sinuses. MRA HEAD FINDINGS Standard intracranial  arterial  anatomy. Mild vertebrobasilar tortuosity. There is no stenosis, beading, or major branch occlusion. Negative for aneurysm. MRA NECK FINDINGS Time-of-flight imaging shows antegrade flow in both carotid and vertebral circulations. Postcontrast imaging does not cover the entire left vertebral artery, but the missed segment is normal on time-of-flight. Imaging is motion degraded, but there is no indication of stenosis or dissection. No beading noted. IMPRESSION: 1. Symmetric injury/infarct of the hippocampus, globus pallidus, cerebellum, and occipital cortex. This is the pattern of hypoxic ischemic injury or toxic/metabolic insult (hypoglycemia, carbon monoxide, ect). 2. Extensive necrosis of right posterior cervical musculature, correlating with history of rhabdomyolysis. Contiguous sizable subgaleal fluid collection continuing to the vertex. 3. Negative intracranial and cervical MRA. Electronically Signed   By: Marnee Spring M.D.   On: 12/15/2016 13:03   Mr Maxine Glenn Neck W Wo Contrast  Result Date: 12/15/2016 CLINICAL DATA:  Presented with altered mental status and rhabdomyolysis. Found unresponsive in [place]. EXAM: MRI HEAD WITHOUT AND WITH CONTRAST MRA HEAD WITHOUT CONTRAST MRA NECK WITHOUT AND WITH CONTRAST TECHNIQUE: Multiplanar, multiecho pulse sequences of the brain and surrounding structures were obtained without and with intravenous contrast. Angiographic images of the Circle of Willis were obtained using MRA technique without intravenous contrast. Angiographic images of the neck were obtained using MRA technique without and with intravenous contrast. Carotid stenosis measurements (when applicable) are obtained utilizing NASCET criteria, using the distal internal carotid diameter as the denominator. CONTRAST:  20mL MULTIHANCE GADOBENATE DIMEGLUMINE 529 MG/ML IV SOLN COMPARISON:  Head CT from yesterday FINDINGS: MRI HEAD FINDINGS Brain: Symmetric restricted diffusion in the hippocampus, occipital cortex,  globus pallidus, and superficial cerebellum. These are primarily metabolically active areas, and susceptible to hypoxic ischemic injury, hypoglycemia, or toxic insult -including carbon monoxide. No hemorrhage or hydrocephalus.  No masslike findings. Vascular: Arterial findings below. Normal dural venous sinus flow voids. Skull and upper cervical spine: The upper right neck musculature is T2 hyperintense, expanded, and nonenhancing - consistent with not myonecrosis and correlating with history of rhabdomyolysis. There is a large subgaleal scalp collection mainly posteriorly and right eccentric. Sinuses/Orbits: Mucosal thickening in the right mastoid air cells. Mild patchy mucosal thickening in the paranasal sinuses. MRA HEAD FINDINGS Standard intracranial arterial anatomy. Mild vertebrobasilar tortuosity. There is no stenosis, beading, or major branch occlusion. Negative for aneurysm. MRA NECK FINDINGS Time-of-flight imaging shows antegrade flow in both carotid and vertebral circulations. Postcontrast imaging does not cover the entire left vertebral artery, but the missed segment is normal on time-of-flight. Imaging is motion degraded, but there is no indication of stenosis or dissection. No beading noted. IMPRESSION: 1. Symmetric injury/infarct of the hippocampus, globus pallidus, cerebellum, and occipital cortex. This is the pattern of hypoxic ischemic injury or toxic/metabolic insult (hypoglycemia, carbon monoxide, ect). 2. Extensive necrosis of right posterior cervical musculature, correlating with history of rhabdomyolysis. Contiguous sizable subgaleal fluid collection continuing to the vertex. 3. Negative intracranial and cervical MRA. Electronically Signed   By: Marnee Spring M.D.   On: 12/15/2016 13:03   Mr Laqueta Jean NF Contrast  Result Date: 12/15/2016 CLINICAL DATA:  Presented with altered mental status and rhabdomyolysis. Found unresponsive in [place]. EXAM: MRI HEAD WITHOUT AND WITH CONTRAST MRA HEAD  WITHOUT CONTRAST MRA NECK WITHOUT AND WITH CONTRAST TECHNIQUE: Multiplanar, multiecho pulse sequences of the brain and surrounding structures were obtained without and with intravenous contrast. Angiographic images of the Circle of Willis were obtained using MRA technique without intravenous contrast. Angiographic images of the neck were obtained using MRA technique without and  with intravenous contrast. Carotid stenosis measurements (when applicable) are obtained utilizing NASCET criteria, using the distal internal carotid diameter as the denominator. CONTRAST:  20mL MULTIHANCE GADOBENATE DIMEGLUMINE 529 MG/ML IV SOLN COMPARISON:  Head CT from yesterday FINDINGS: MRI HEAD FINDINGS Brain: Symmetric restricted diffusion in the hippocampus, occipital cortex, globus pallidus, and superficial cerebellum. These are primarily metabolically active areas, and susceptible to hypoxic ischemic injury, hypoglycemia, or toxic insult -including carbon monoxide. No hemorrhage or hydrocephalus.  No masslike findings. Vascular: Arterial findings below. Normal dural venous sinus flow voids. Skull and upper cervical spine: The upper right neck musculature is T2 hyperintense, expanded, and nonenhancing - consistent with not myonecrosis and correlating with history of rhabdomyolysis. There is a large subgaleal scalp collection mainly posteriorly and right eccentric. Sinuses/Orbits: Mucosal thickening in the right mastoid air cells. Mild patchy mucosal thickening in the paranasal sinuses. MRA HEAD FINDINGS Standard intracranial arterial anatomy. Mild vertebrobasilar tortuosity. There is no stenosis, beading, or major branch occlusion. Negative for aneurysm. MRA NECK FINDINGS Time-of-flight imaging shows antegrade flow in both carotid and vertebral circulations. Postcontrast imaging does not cover the entire left vertebral artery, but the missed segment is normal on time-of-flight. Imaging is motion degraded, but there is no indication of  stenosis or dissection. No beading noted. IMPRESSION: 1. Symmetric injury/infarct of the hippocampus, globus pallidus, cerebellum, and occipital cortex. This is the pattern of hypoxic ischemic injury or toxic/metabolic insult (hypoglycemia, carbon monoxide, ect). 2. Extensive necrosis of right posterior cervical musculature, correlating with history of rhabdomyolysis. Contiguous sizable subgaleal fluid collection continuing to the vertex. 3. Negative intracranial and cervical MRA. Electronically Signed   By: Marnee SpringJonathon  Watts M.D.   On: 12/15/2016 13:03    Anti-infectives: Anti-infectives    Start     Dose/Rate Route Frequency Ordered Stop   12/16/16 0000  cephALEXin (KEFLEX) 500 MG capsule     500 mg Oral Every 8 hours 12/16/16 0925 12/22/16 2359   12/15/16 1000  cephALEXin (KEFLEX) capsule 500 mg     500 mg Oral Every 8 hours 12/15/16 0934        Assessment/Plan:  Creatinine is much improved. Patient doing well following rhabdomyolysis event. No sign of abscess. Discussed with Dr. Tildon HuskyModi. Patient can likely be discharged today. No need for surgical follow-up.  Lattie Hawichard E Domani Bakos, MD, FACS  12/16/2016

## 2016-12-28 ENCOUNTER — Observation Stay
Admission: EM | Admit: 2016-12-28 | Discharge: 2016-12-30 | Disposition: A | Discharge status: Home / Self Care | Payer: Self-pay | Attending: Surgery | Admitting: Surgery

## 2016-12-28 ENCOUNTER — Emergency Department: Payer: Self-pay

## 2016-12-28 ENCOUNTER — Encounter: Payer: Self-pay | Admitting: Emergency Medicine

## 2016-12-28 DIAGNOSIS — Z8673 Personal history of transient ischemic attack (TIA), and cerebral infarction without residual deficits: Secondary | ICD-10-CM | POA: Insufficient documentation

## 2016-12-28 DIAGNOSIS — Z7982 Long term (current) use of aspirin: Secondary | ICD-10-CM | POA: Insufficient documentation

## 2016-12-28 DIAGNOSIS — F17210 Nicotine dependence, cigarettes, uncomplicated: Secondary | ICD-10-CM | POA: Insufficient documentation

## 2016-12-28 DIAGNOSIS — R4182 Altered mental status, unspecified: Secondary | ICD-10-CM | POA: Insufficient documentation

## 2016-12-28 DIAGNOSIS — Z79899 Other long term (current) drug therapy: Secondary | ICD-10-CM | POA: Insufficient documentation

## 2016-12-28 DIAGNOSIS — M4802 Spinal stenosis, cervical region: Secondary | ICD-10-CM | POA: Insufficient documentation

## 2016-12-28 DIAGNOSIS — G9341 Metabolic encephalopathy: Secondary | ICD-10-CM | POA: Insufficient documentation

## 2016-12-28 DIAGNOSIS — L0211 Cutaneous abscess of neck: Principal | ICD-10-CM | POA: Insufficient documentation

## 2016-12-28 DIAGNOSIS — L03221 Cellulitis of neck: Secondary | ICD-10-CM | POA: Insufficient documentation

## 2016-12-28 DIAGNOSIS — L0291 Cutaneous abscess, unspecified: Secondary | ICD-10-CM

## 2016-12-28 DIAGNOSIS — M60009 Infective myositis, unspecified site: Secondary | ICD-10-CM | POA: Insufficient documentation

## 2016-12-28 HISTORY — DX: Cerebral infarction, unspecified: I63.9

## 2016-12-28 LAB — CBC WITH DIFFERENTIAL/PLATELET
Basophils Absolute: 0.1 10*3/uL (ref 0–0.1)
Basophils Relative: 1 %
Eosinophils Absolute: 0.1 10*3/uL (ref 0–0.7)
Eosinophils Relative: 1 %
HCT: 39.4 % — ABNORMAL LOW (ref 40.0–52.0)
Hemoglobin: 13.9 g/dL (ref 13.0–18.0)
Lymphocytes Relative: 17 %
Lymphs Abs: 1.8 10*3/uL (ref 1.0–3.6)
MCH: 31.6 pg (ref 26.0–34.0)
MCHC: 35.3 g/dL (ref 32.0–36.0)
MCV: 89.6 fL (ref 80.0–100.0)
Monocytes Absolute: 0.9 10*3/uL (ref 0.2–1.0)
Monocytes Relative: 8 %
Neutro Abs: 7.8 10*3/uL — ABNORMAL HIGH (ref 1.4–6.5)
Neutrophils Relative %: 73 %
Platelets: 461 10*3/uL — ABNORMAL HIGH (ref 150–440)
RBC: 4.4 MIL/uL (ref 4.40–5.90)
RDW: 12.5 % (ref 11.5–14.5)
WBC: 10.6 10*3/uL (ref 3.8–10.6)

## 2016-12-28 LAB — COMPREHENSIVE METABOLIC PANEL
ALT: 64 U/L — ABNORMAL HIGH (ref 17–63)
AST: 42 U/L — ABNORMAL HIGH (ref 15–41)
Albumin: 4.2 g/dL (ref 3.5–5.0)
Alkaline Phosphatase: 85 U/L (ref 38–126)
Anion gap: 8 (ref 5–15)
BUN: 17 mg/dL (ref 6–20)
CO2: 27 mmol/L (ref 22–32)
Calcium: 9.4 mg/dL (ref 8.9–10.3)
Chloride: 104 mmol/L (ref 101–111)
Creatinine, Ser: 1.04 mg/dL (ref 0.61–1.24)
GFR calc Af Amer: >60 mL/min (ref >60)
GFR calc non Af Amer: >60 mL/min (ref >60)
Glucose, Bld: 136 mg/dL — ABNORMAL HIGH (ref 65–99)
Potassium: 3.7 mmol/L (ref 3.5–5.1)
Sodium: 139 mmol/L (ref 135–145)
Total Bilirubin: 0.7 mg/dL (ref 0.3–1.2)
Total Protein: 7.6 g/dL (ref 6.5–8.1)

## 2016-12-28 MED ORDER — PIPERACILLIN-TAZOBACTAM 3.375 G IVPB
3.3750 g | Freq: Once | INTRAVENOUS | Status: AC
  Administered 2016-12-28: 3.375 g via INTRAVENOUS
  Filled 2016-12-28: qty 50

## 2016-12-28 MED ORDER — PIPERACILLIN-TAZOBACTAM 3.375 G IVPB 30 MIN: 3.3750 g | Freq: Three times a day (TID) | INTRAVENOUS | Status: DC

## 2016-12-28 MED ORDER — IOPAMIDOL (ISOVUE-300) INJECTION 61%
75.0000 mL | Freq: Once | INTRAVENOUS | Status: AC | PRN
  Administered 2016-12-28: 75 mL via INTRAVENOUS
  Filled 2016-12-28: qty 75

## 2016-12-28 MED ORDER — VANCOMYCIN HCL IN DEXTROSE 1-5 GM/200ML-% IV SOLN
1000.0000 mg | Freq: Once | INTRAVENOUS | Status: AC
  Administered 2016-12-28: 1000 mg via INTRAVENOUS
  Filled 2016-12-28: qty 200

## 2016-12-28 MED ORDER — HEPARIN SODIUM (PORCINE) 5000 UNIT/ML IJ SOLN
5000.0000 [IU] | Freq: Three times a day (TID) | INTRAMUSCULAR | Status: DC
  Administered 2016-12-29 – 2016-12-30 (×2): 5000 [IU] via SUBCUTANEOUS
  Filled 2016-12-28 (×2): qty 1

## 2016-12-28 MED ORDER — NICOTINE 14 MG/24HR TD PT24
14.0000 mg | MEDICATED_PATCH | Freq: Every day | TRANSDERMAL | Status: DC
  Administered 2016-12-29 (×2): 14 mg via TRANSDERMAL
  Filled 2016-12-28 (×3): qty 1

## 2016-12-28 MED ORDER — ONDANSETRON HCL 4 MG PO TABS: 4.0000 mg | ORAL_TABLET | Freq: Four times a day (QID) | ORAL | Status: DC | PRN

## 2016-12-28 MED ORDER — DEXTROSE-NACL 5-0.9 % IV SOLN
INTRAVENOUS | Status: DC
  Administered 2016-12-28 – 2016-12-30 (×4): via INTRAVENOUS

## 2016-12-28 MED ORDER — MORPHINE SULFATE (PF) 4 MG/ML IV SOLN
4.0000 mg | INTRAVENOUS | Status: DC | PRN
  Administered 2016-12-29 (×3): 4 mg via INTRAVENOUS
  Filled 2016-12-28 (×3): qty 1

## 2016-12-28 MED ORDER — ONDANSETRON HCL 4 MG/2ML IJ SOLN: 4.0000 mg | Freq: Four times a day (QID) | INTRAMUSCULAR | Status: DC | PRN

## 2016-12-28 NOTE — ED Notes (Signed)
Pt had a stroke 2 weeks ago and was in the hospital - he had a "pressure wound" and was given Keflex on the right side of his neck - they were told if the area became any worse that he should return to the hospital

## 2016-12-28 NOTE — H&P (Signed)
Donald Becker is an 36 y.o. male.    Chief Complaint: Right neck pain  HPI: This a patient who was found down in his Donald Becker 2 weeks ago with a diagnosis of possible stroke resulting in rhabdomyolysis of his right neck right posterior chest and right buttock area. He's had ongoing neck pain and states he came to the emergency room because he could not take it any longer. He does not think he's had fevers nor chills and has had no nausea or vomiting. The cause of his stroke is not understood at this time  Past Medical History:  Diagnosis Date  . CVA (cerebral vascular accident) [facility])     Past Surgical History:  Procedure Laterality Date  . HERNIA REPAIR     There is no family history of stroke. No family history on file. Social History:  reports that he has been smoking Cigarettes.  He has never used smokeless tobacco. He reports that he drinks alcohol. He reports that he does not use drugs.  Allergies: No Known Allergies   (Not in a hospital admission)   Review of Systems  Constitutional: Negative for chills and fever.  HENT: Negative.   Eyes: Negative.   Respiratory: Negative.   Cardiovascular: Negative.   Gastrointestinal: Negative.   Genitourinary: Negative.   Musculoskeletal: Negative.   Skin: Negative.   Neurological: Negative.   Endo/Heme/Allergies: Negative.   Psychiatric/Behavioral: Negative.      Physical Exam:  BP (!) 148/97 (BP Location: Left Arm)   Pulse (!) 101   Temp 98.1 F (36.7 C) (Oral)   Resp 18   Ht '5\' 11"'$  (1.803 m)   Wt 230 lb (104.3 kg)   SpO2 98%   BMI 32.08 kg/m   Physical Exam  Constitutional: He is oriented to person, place, and time and well-developed, well-nourished, and in no distress. No distress.  HENT:  Head: Normocephalic and atraumatic.  Eyes: Right eye exhibits no discharge. Left eye exhibits no discharge. No scleral icterus.  Neck:  Posterior neck swelling on the right with erythema no bullae no erosions. Minimally tender.   Cardiovascular: Normal rate and regular rhythm.   Pulmonary/Chest: Effort normal. No respiratory distress.  Abdominal: Soft. He exhibits no distension.  Musculoskeletal: Normal range of motion. He exhibits tenderness and deformity.  Right-sided posterior neck mass with overlying erythema  Neurological: He is alert and oriented to person, place, and time.  Skin: Skin is warm. No rash noted. He is not diaphoretic. There is erythema.  Psychiatric: Mood and affect normal.  Vitals reviewed.       Results for orders placed or performed during the hospital encounter of 12/28/16 (from the past 48 hour(s))  CBC with Differential     Status: Abnormal   Collection Time: 12/28/16  8:56 PM  Result Value Ref Range   WBC 10.6 3.8 - 10.6 K/uL   RBC 4.40 4.40 - 5.90 MIL/uL   Hemoglobin 13.9 13.0 - 18.0 g/dL   HCT 39.4 (L) 40.0 - 52.0 %   MCV 89.6 80.0 - 100.0 fL   MCH 31.6 26.0 - 34.0 pg   MCHC 35.3 32.0 - 36.0 g/dL   RDW 12.5 11.5 - 14.5 %   Platelets 461 (H) 150 - 440 K/uL   Neutrophils Relative % 73 %   Neutro Abs 7.8 (H) 1.4 - 6.5 K/uL   Lymphocytes Relative 17 %   Lymphs Abs 1.8 1.0 - 3.6 K/uL   Monocytes Relative 8 %   Monocytes Absolute 0.9 0.2 -  1.0 K/uL   Eosinophils Relative 1 %   Eosinophils Absolute 0.1 0 - 0.7 K/uL   Basophils Relative 1 %   Basophils Absolute 0.1 0 - 0.1 K/uL  Comprehensive metabolic panel     Status: Abnormal   Collection Time: 12/28/16  8:56 PM  Result Value Ref Range   Sodium 139 135 - 145 mmol/L   Potassium 3.7 3.5 - 5.1 mmol/L   Chloride 104 101 - 111 mmol/L   CO2 27 22 - 32 mmol/L   Glucose, Bld 136 (H) 65 - 99 mg/dL   BUN 17 6 - 20 mg/dL   Creatinine, Ser 1.04 0.61 - 1.24 mg/dL   Calcium 9.4 8.9 - 10.3 mg/dL   Total Protein 7.6 6.5 - 8.1 g/dL   Albumin 4.2 3.5 - 5.0 g/dL   AST 42 (H) 15 - 41 U/L   ALT 64 (H) 17 - 63 U/L   Alkaline Phosphatase 85 38 - 126 U/L   Total Bilirubin 0.7 0.3 - 1.2 mg/dL   GFR calc non Af Amer >60 >60 mL/min   GFR  calc Af Amer >60 >60 mL/min    Comment: (NOTE) The eGFR has been calculated using the CKD EPI equation. This calculation has not been validated in all clinical situations. eGFR's persistently <60 mL/min signify possible Chronic Kidney Disease.    Anion gap 8 5 - 15   Ct Soft Tissue Neck W Contrast  Result Date: 12/28/2016 CLINICAL DATA:  Right-sided neck cellulitis EXAM: CT NECK WITH CONTRAST TECHNIQUE: Multidetector CT imaging of the neck was performed using the standard protocol following the bolus administration of intravenous contrast. CONTRAST:  73m ISOVUE-300 IOPAMIDOL (ISOVUE-300) INJECTION 61% COMPARISON:  None. FINDINGS: Pharynx and larynx: The nasopharynx is clear. The oropharynx and hypopharynx are normal. The epiglottis, supraglottic larynx, glottis and subglottic larynx are normal. No retropharyngeal collection. The parapharyngeal spaces are preserved. The visible oral cavity and base of tongue are normal. Salivary glands: The parotid, sublingual and submandibular glands are normal. No sialolithiasis or salivary ductal dilatation. Thyroid: Normal Lymph nodes: No enlarged or abnormal density cervical lymph nodes. Vascular: The major cervical vessels are patent. Limited intracranial: Unremarkable Visualized orbits: Normal Mastoids and visualized paranasal sinuses: FLAIR Skeleton: There is a central disc protrusion at the C3-4 level causing moderate central spinal canal stenosis. Multifocal disc osteophyte complexes and/or ossification of the posterior longitudinal ligament also cause mild-to-moderate spinal canal stenosis at multiple levels. Upper chest: Normal Other: Within the right posterior paraspinous musculature, beginning at the occiput foot, there is a large intramuscular fluid collection involving the right-sided paraspinous musculature, including the right splenius capitis, rectus capitis posterior major and minor and extending into the right trapezius muscle, where the largest  component of the collection dislocated. Overall, the collection measures 4.6 x 10.6 x 10.0 cm (AP x Transverse x CC). There is edema of the overlying subcutaneous fat. IMPRESSION: 1. Intramuscular abscess of the right paraspinous neck musculature, involving multiple muscles but with the largest component located within the trapezius muscle. Overlying inflammatory change consistent with cellulitis. 2. No venous thrombosis or other acute vascular abnormality. 3. No retropharyngeal or mediastinal extension. 4. Cervical spine degenerative changes resulting in multilevel mild-to-moderate spinal canal stenosis, greatest at C3-C4. This could be further evaluated with MRI on a nonemergent basis. Electronically Signed   By: KUlyses JarredM.D.   On: 12/28/2016 22:35     Assessment/Plan  This a patient with a posterior neck mass and CT scan is been personally reviewed.  I'm not certain that this is an abscess but likely a liquefied muscle following rhabdomyolysis. I seen the patient previously when he was in the hospital and the source of his stroke is not clear. He was found down in a van and experienced swelling of the neck posterior back and buttock all of improved if except his neck.  With CT findings suggestive of liquefied muscle without obvious infection but the uncertainty of whether or not infection is present I would recommend admission to the hospital antibiotics of arty been instituted. I would continue antibiotics for now and recommend aspiration to determine if this is infected. If it is not infected and aspiration may improve his symptoms radiated if it is infected he may require more extensive surgical drainage procedure and may require an ENT consultation if that is indicated. This is discussed with he and his family as well as with the provider in the emergency room.  Florene Glen, MD, FACS

## 2016-12-28 NOTE — ED Provider Notes (Signed)
[facility] Emergency Department Provider Note  ____________________________________________  Time seen: Approximately 8:53 PM  I have reviewed the triage vital signs and the nursing notes.   HISTORY  Chief Complaint Abscess    HPI Donald Becker is a 36 y.o. male with a history of CVA presents to the emergency department with a 3 cm x 3 cm region of circumferential cellulitis and induration at the posterior neck. Patient states that he had a CVA 2 weeks ago and has since been sedentary. Patient was discharged with Keflex for cellulitis of the neck at ED encounter on 5/515/2018. Patient's wife has also been using Silvadene and Neosporin. Patient denies fever, chills, nausea, vomiting or abdominal pain. No acute changes in sensation. No acute changes in radiculopathy or weakness since CVA. He denies associated chest pain, chest tightness or shortness of breath.   Past Medical History:  Diagnosis Date  . CVA (cerebral vascular accident) [facility](HCC)     Patient Active Problem List   Diagnosis Date Noted  . Abscess, neck 12/28/2016  . Neck abscess   . Altered mental status   . Pressure injury of skin 12/14/2016  . AKI (acute kidney injury) (HCC) 12/12/2016    Past Surgical History:  Procedure Laterality Date  . HERNIA REPAIR      Prior to Admission medications   Medication Sig Start Date End Date Taking? Authorizing Provider  aspirin EC 81 MG EC tablet Take 1 tablet (81 mg total) by mouth daily. 12/17/16   Adrian SaranMody, Sital, MD  liver oil-zinc oxide (DESITIN) 40 % ointment Apply topically 2 (two) times daily. 12/14/16   Adrian SaranMody, Sital, MD  nicotine (NICODERM CQ - DOSED IN MG/24 HOURS) 14 mg/24hr patch Place 1 patch (14 mg total) onto the skin daily. 12/16/16   Adrian SaranMody, Sital, MD  silver sulfADIAZINE (SILVADENE) 1 % cream Apply topically 2 (two) times daily. 12/14/16   Adrian SaranMody, Sital, MD    Allergies Patient has no known allergies.  No family history on file.  Social  History Social History  Substance Use Topics  . Smoking status: Current Every Day Smoker    Types: Cigarettes  . Smokeless tobacco: Never Used  . Alcohol use Yes     Comment: 2-3 days per week     Review of Systems  Constitutional: No fever/chills Eyes: No visual changes. No discharge ENT: No upper respiratory complaints. Cardiovascular: no chest pain. Respiratory: no cough. No SOB. Gastrointestinal: No abdominal pain.  No nausea, no vomiting.  No diarrhea.  No constipation. Musculoskeletal: Patient has posterior neck pain.  Skin: Patient has a 3cm x 3cm region of circumferential cellulitis at posterior neck.  Neurological: Negative for headaches, focal weakness or numbness.   ____________________________________________   PHYSICAL EXAM:  VITAL SIGNS: ED Triage Vitals  Enc Vitals Group     BP 12/28/16 1932 (!) 142/102     Pulse Rate 12/28/16 1932 (!) 101     Resp 12/28/16 1932 18     Temp 12/28/16 1932 98.1 F (36.7 C)     Temp Source 12/28/16 1932 Oral     SpO2 12/28/16 1932 98 %     Weight 12/28/16 1931 230 lb (104.3 kg)     Height 12/28/16 1931 5\' 11"  (1.803 m)     Head Circumference --      Peak Flow --      Pain Score 12/28/16 1951 9     Pain Loc --      Pain Edu? --  Excl. in GC? --      Constitutional: Alert and oriented. Well appearing and in no acute distress. Eyes: Conjunctivae are normal. PERRL. EOMI. Head: Atraumatic.      Mouth/Throat: Mucous membranes are moist.  Neck: Full range of motion Hematological/Lymphatic/Immunilogical: No cervical lymphadenopathy.  Cardiovascular: Normal rate, regular rhythm. Normal S1 and S2.  Good peripheral circulation. Respiratory: Normal respiratory effort without tachypnea or retractions. Lungs CTAB. Good air entry to the bases with no decreased or absent breath sounds. Gastrointestinal: Bowel sounds 4 quadrants. Soft and nontender to palpation. No guarding or rigidity. No palpable masses. No distention. No  CVA tenderness. Musculoskeletal: Full range of motion to all extremities. No gross deformities appreciated. Neurologic:  Normal speech and language. No gross focal neurologic deficits are appreciated.  Skin: Patient has 3cm x 3cm of circumferential cellulitis at the posterior neck with associated induration. No fluctuance. Psychiatric: Mood and affect are normal. Speech and behavior are normal. Patient exhibits appropriate insight and judgement.   ____________________________________________   LABS (all labs ordered are listed, but only abnormal results are displayed)  Labs Reviewed  CBC WITH DIFFERENTIAL/PLATELET - Abnormal; Notable for the following:       Result Value   HCT 39.4 (*)    Platelets 461 (*)    Neutro Abs 7.8 (*)    All other components within normal limits  COMPREHENSIVE METABOLIC PANEL - Abnormal; Notable for the following:    Glucose, Bld 136 (*)    AST 42 (*)    ALT 64 (*)    All other components within normal limits  CBC  CREATININE, SERUM  PROTIME-INR  CBC   ____________________________________________  EKG   ____________________________________________  RADIOLOGY Geraldo Pitter, personally viewed and evaluated these images as part of my medical decision making, as well as reviewing the written report by the radiologist.   Ct Soft Tissue Neck W Contrast  Result Date: 12/28/2016 CLINICAL DATA:  Right-sided neck cellulitis EXAM: CT NECK WITH CONTRAST TECHNIQUE: Multidetector CT imaging of the neck was performed using the standard protocol following the bolus administration of intravenous contrast. CONTRAST:  75mL ISOVUE-300 IOPAMIDOL (ISOVUE-300) INJECTION 61% COMPARISON:  None. FINDINGS: Pharynx and larynx: The nasopharynx is clear. The oropharynx and hypopharynx are normal. The epiglottis, supraglottic larynx, glottis and subglottic larynx are normal. No retropharyngeal collection. The parapharyngeal spaces are preserved. The visible oral cavity and  base of tongue are normal. Salivary glands: The parotid, sublingual and submandibular glands are normal. No sialolithiasis or salivary ductal dilatation. Thyroid: Normal Lymph nodes: No enlarged or abnormal density cervical lymph nodes. Vascular: The major cervical vessels are patent. Limited intracranial: Unremarkable Visualized orbits: Normal Mastoids and visualized paranasal sinuses: FLAIR Skeleton: There is a central disc protrusion at the C3-4 level causing moderate central spinal canal stenosis. Multifocal disc osteophyte complexes and/or ossification of the posterior longitudinal ligament also cause mild-to-moderate spinal canal stenosis at multiple levels. Upper chest: Normal Other: Within the right posterior paraspinous musculature, beginning at the occiput foot, there is a large intramuscular fluid collection involving the right-sided paraspinous musculature, including the right splenius capitis, rectus capitis posterior major and minor and extending into the right trapezius muscle, where the largest component of the collection dislocated. Overall, the collection measures 4.6 x 10.6 x 10.0 cm (AP x Transverse x CC). There is edema of the overlying subcutaneous fat. IMPRESSION: 1. Intramuscular abscess of the right paraspinous neck musculature, involving multiple muscles but with the largest component located within the trapezius muscle. Overlying inflammatory  change consistent with cellulitis. 2. No venous thrombosis or other acute vascular abnormality. 3. No retropharyngeal or mediastinal extension. 4. Cervical spine degenerative changes resulting in multilevel mild-to-moderate spinal canal stenosis, greatest at C3-C4. This could be further evaluated with MRI on a nonemergent basis. Electronically Signed   By: Deatra Robinson M.D.   On: 12/28/2016 22:35    ____________________________________________    PROCEDURES  Procedure(s) performed:    Procedures    Medications  vancomycin (VANCOCIN)  IVPB 1000 mg/200 mL premix (1,000 mg Intravenous New Bag/Given 12/28/16 2331)  piperacillin-tazobactam (ZOSYN) IVPB 3.375 g (3.375 g Intravenous New Bag/Given 12/28/16 2330)  nicotine (NICODERM CQ - dosed in mg/24 hours) patch 14 mg (not administered)  heparin injection 5,000 Units (not administered)  dextrose 5 %-0.9 % sodium chloride infusion (not administered)  ondansetron (ZOFRAN) tablet 4 mg (not administered)    Or  ondansetron (ZOFRAN) injection 4 mg (not administered)  piperacillin-tazobactam (ZOSYN) IVPB 3.375 g (not administered)  morphine 4 MG/ML injection 4 mg (not administered)  iopamidol (ISOVUE-300) 61 % injection 75 mL (75 mLs Intravenous Contrast Given 12/28/16 2152)     ____________________________________________   INITIAL IMPRESSION / ASSESSMENT AND PLAN / ED COURSE  Pertinent labs & imaging results that were available during my care of the patient were reviewed by me and considered in my medical decision making (see chart for details).  Review of the [place] CSRS was performed in accordance of the [facility] prior to dispensing any controlled drugs.     Assessment and Plan:  Paraspinal Intramuscular Neck Abscess Patient presents to the emergency department with a 3 cm x 3 cm region of circumferential cellulitis of the posterior neck. CT soft tissue neck revealed an intramuscular paraspinal muscle abscess. CBC and CMP were reassuring. My supervising physician, Dr. Manson Passey, was consulted and we agreed that surgical consultation was necessary. Gen. surgery was consulted and Dr. Excell Seltzer came and personally evaluated the patient. Dr. Excell Seltzer assumed care for the patient for admission to the hospital. Patient was started empirically on vancomycin and Zosyn in the emergency department. Vital signs were reassuring prior to discharge.   ____________________________________________  FINAL CLINICAL IMPRESSION(S) / ED DIAGNOSES  Final diagnoses:  Abscess      NEW MEDICATIONS STARTED  DURING THIS VISIT:  New Prescriptions   No medications on file        This chart was dictated using voice recognition software/Dragon. Despite best efforts to proofread, errors can occur which can change the meaning. Any change was purely unintentional.    Orvil Feil, PA-C 12/28/16 2342    Darci Current, MD 12/29/16 [id number]

## 2016-12-28 NOTE — ED Triage Notes (Signed)
Pt comes into the Ed via POV c/o abscess to the back of his neck.  Patient has been using antibiotics and silvadene cream but with no pain relief.  Patient in NAD at this time with even and unlabored respirations.  Patient denies any fevers at this time.

## 2016-12-29 ENCOUNTER — Observation Stay: Payer: Self-pay

## 2016-12-29 DIAGNOSIS — L0211 Cutaneous abscess of neck: Secondary | ICD-10-CM

## 2016-12-29 LAB — CBC
HEMATOCRIT: 37.5 % — AB (ref 40.0–52.0)
HEMOGLOBIN: 13.1 g/dL (ref 13.0–18.0)
MCH: 31.7 pg (ref 26.0–34.0)
MCHC: 35 g/dL (ref 32.0–36.0)
MCV: 90.5 fL (ref 80.0–100.0)
Platelets: 424 10*3/uL (ref 150–440)
RBC: 4.15 MIL/uL — ABNORMAL LOW (ref 4.40–5.90)
RDW: 13.1 % (ref 11.5–14.5)
WBC: 9.7 10*3/uL (ref 3.8–10.6)

## 2016-12-29 LAB — PROTIME-INR
INR: 0.96
Prothrombin Time: 12.8 seconds (ref 11.4–15.2)

## 2016-12-29 MED ORDER — PIPERACILLIN-TAZOBACTAM 3.375 G IVPB
3.3750 g | Freq: Three times a day (TID) | INTRAVENOUS | Status: DC
  Administered 2016-12-29 – 2016-12-30 (×4): 3.375 g via INTRAVENOUS
  Filled 2016-12-29 (×6): qty 50

## 2016-12-29 MED ORDER — MIDAZOLAM HCL 5 MG/5ML IJ SOLN
INTRAMUSCULAR | Status: AC
  Filled 2016-12-29: qty 5

## 2016-12-29 MED ORDER — FENTANYL CITRATE (PF) 100 MCG/2ML IJ SOLN
INTRAMUSCULAR | Status: AC
  Filled 2016-12-29: qty 2

## 2016-12-29 NOTE — Care Management (Signed)
Patient lives at home with wife.  [facility]  Previous admission patient was provided application to [facility], [facility] and a charity walker.  Patient was also arranged charity home health services through [facility] due to acute stroke.  Notifed by Barbara Cowerjason [facility], that patient declined services after discharge and states he didn't feel like he needed them.  RNCM following for discharge needs

## 2016-12-29 NOTE — Procedures (Signed)
US was used to evaluate the areas of concern along the posterior right neck and right upper back.   No distinct fluid collections but the muscles in this area are edematous.  US guided aspiration of the right trapezius yielded 2.5 ml of bloody fluid.  No purulent fluid.  Minimal blood loss and no immediate complication.

## 2016-12-29 NOTE — Progress Notes (Signed)
CC: cellulits Subjective: Doing better, still some neck pain AVSS CT personally reviewed , no definitive abscess  Objective: Vital signs in last 24 hours: Temp:  [97.6 F (36.4 C)-98.5 F (36.9 C)] 98 F (36.7 C) (06/01 1140) Pulse Rate:  [75-101] 76 (06/01 1456) Resp:  [15-20] 15 (06/01 1456) BP: (125-158)/(77-103) 144/103 (06/01 1456) SpO2:  [95 %-98 %] 95 % (06/01 1456) Weight:  [104.3 kg (230 lb)-109 kg (240 lb 6.4 oz)] 109 kg (240 lb 6.4 oz) (06/01 0044)    Intake/Output from previous day: No intake/output data recorded. Intake/Output this shift: No intake/output data recorded.  Physical exam: NAD, alert Neck cellulitis and induration on nuchal area, no necrotizing infection, no abscess Abd: soft , NT,  Ext: no edema and well perfsused Lab Results: CBC   Recent Labs  12/28/16 2056 12/29/16 0419  WBC 10.6 9.7  HGB 13.9 13.1  HCT 39.4* 37.5*  PLT 461* 424   BMET  Recent Labs  12/28/16 2056  NA 139  K 3.7  CL 104  CO2 27  GLUCOSE 136*  BUN 17  CREATININE 1.04  CALCIUM 9.4   PT/INR  Recent Labs  12/29/16 0419  LABPROT 12.8  INR 0.96   ABG No results for input(s): PHART, HCO3 in the last 72 hours.  Invalid input(s): PCO2, PO2  Studies/Results: Ct Soft Tissue Neck W Contrast  Result Date: 12/28/2016 CLINICAL DATA:  Right-sided neck cellulitis EXAM: CT NECK WITH CONTRAST TECHNIQUE: Multidetector CT imaging of the neck was performed using the standard protocol following the bolus administration of intravenous contrast. CONTRAST:  75mL ISOVUE-300 IOPAMIDOL (ISOVUE-300) INJECTION 61% COMPARISON:  None. FINDINGS: Pharynx and larynx: The nasopharynx is clear. The oropharynx and hypopharynx are normal. The epiglottis, supraglottic larynx, glottis and subglottic larynx are normal. No retropharyngeal collection. The parapharyngeal spaces are preserved. The visible oral cavity and base of tongue are normal. Salivary glands: The parotid, sublingual and  submandibular glands are normal. No sialolithiasis or salivary ductal dilatation. Thyroid: Normal Lymph nodes: No enlarged or abnormal density cervical lymph nodes. Vascular: The major cervical vessels are patent. Limited intracranial: Unremarkable Visualized orbits: Normal Mastoids and visualized paranasal sinuses: FLAIR Skeleton: There is a central disc protrusion at the C3-4 level causing moderate central spinal canal stenosis. Multifocal disc osteophyte complexes and/or ossification of the posterior longitudinal ligament also cause mild-to-moderate spinal canal stenosis at multiple levels. Upper chest: Normal Other: Within the right posterior paraspinous musculature, beginning at the occiput foot, there is a large intramuscular fluid collection involving the right-sided paraspinous musculature, including the right splenius capitis, rectus capitis posterior major and minor and extending into the right trapezius muscle, where the largest component of the collection dislocated. Overall, the collection measures 4.6 x 10.6 x 10.0 cm (AP x Transverse x CC). There is edema of the overlying subcutaneous fat. IMPRESSION: 1. Intramuscular abscess of the right paraspinous neck musculature, involving multiple muscles but with the largest component located within the trapezius muscle. Overlying inflammatory change consistent with cellulitis. 2. No venous thrombosis or other acute vascular abnormality. 3. No retropharyngeal or mediastinal extension. 4. Cervical spine degenerative changes resulting in multilevel mild-to-moderate spinal canal stenosis, greatest at C3-C4. This could be further evaluated with MRI on a nonemergent basis. Electronically Signed   By: Deatra RobinsonKevin  Herman M.D.   On: 12/28/2016 22:35    Anti-infectives: Anti-infectives    Start     Dose/Rate Route Frequency Ordered Stop   12/29/16 0800  piperacillin-tazobactam (ZOSYN) IVPB 3.375 g  3.375 g 12.5 mL/hr over 240 Minutes Intravenous Every 8 hours  12/29/16 0102     12/28/16 2345  piperacillin-tazobactam (ZOSYN) IVPB 3.375 g  Status:  Discontinued     3.375 g 100 mL/hr over 30 Minutes Intravenous Every 8 hours 12/28/16 2340 12/29/16 0101   12/28/16 2300  vancomycin (VANCOCIN) IVPB 1000 mg/200 mL premix     1,000 mg 200 mL/hr over 60 Minutes Intravenous  Once 12/28/16 2258 12/29/16 0031   12/28/16 2300  piperacillin-tazobactam (ZOSYN) IVPB 3.375 g     3.375 g 12.5 mL/hr over 240 Minutes Intravenous  Once 12/28/16 2258 12/29/16 0330      Assessment/Plan: Muscle liquifaction ? From rhabdo U/S no evidence of abscess No surgical intervention Continue IV A/BS  Sterling Big, MD, [facility]  12/29/2016

## 2016-12-30 MED ORDER — SULFAMETHOXAZOLE-TRIMETHOPRIM 800-160 MG PO TABS: 1.0000 | ORAL_TABLET | Freq: Two times a day (BID) | ORAL | 0 refills | Status: DC

## 2016-12-30 MED ORDER — HYDROCODONE-ACETAMINOPHEN 5-325 MG PO TABS: 1.0000 | ORAL_TABLET | Freq: Four times a day (QID) | ORAL | 0 refills | Status: DC | PRN

## 2016-12-30 MED ORDER — AMOXICILLIN-POT CLAVULANATE 875-125 MG PO TABS: 1.0000 | ORAL_TABLET | Freq: Two times a day (BID) | ORAL | 0 refills | Status: AC

## 2016-12-30 NOTE — Discharge Summary (Signed)
Patient ID: Donald Becker MRN: [id number] DOB/AGE: 1980/09/16 36 y.o.  Admit date: 12/28/2016 Discharge date: 12/30/2016   Discharge Diagnoses:  Active Problems:   Abscess, neck   Procedures: U/S guided aspiration of post neck   Hospital Course: Is a 36 year old male with a prior history of altered mental status and metabolic encephalopathy apparently develop rather myelitis is a few weeks ago now comes with severe to severe neck pain immediately and showing questionable abscess or fluid collections clinically more consistent with cellulitis. Patient was started on broad-spectrum antibiotics and ultrasound-guided needle aspiration was performed by radiologist showing no evidence of abscess. He clinically improved after antibiotic therapy. At time of discharge he was ambulating tolerating regular diet and his vital signs were stable he was afebrile and his white count was normal. His physical exam at that time of discharge showed a male in no acute distress awake alert neck: There is some induration in the posterior neck and trapezius area. No evidence of necrotizing infection or abscess. Some resolving cellulitis. Abdomen: Soft nontender no peritonitis. Extremities: Well perfused no edema. Condition at time of discharge is  stable. He will complete a 10 day course of antibiotic therapy and will see [place] back in the outpatient setting.   Disposition: 01-Home or Self Care  Discharge Instructions    Call MD for:  difficulty breathing, headache or visual disturbances    Complete by:  As directed    Call MD for:  extreme fatigue    Complete by:  As directed    Call MD for:  hives    Complete by:  As directed    Call MD for:  persistant dizziness or light-headedness    Complete by:  As directed    Call MD for:  persistant nausea and vomiting    Complete by:  As directed    Call MD for:  redness, tenderness, or signs of infection (pain, swelling, redness, odor or green/yellow discharge around  incision site)    Complete by:  As directed    Call MD for:  severe uncontrolled pain    Complete by:  As directed    Call MD for:  temperature >100.4    Complete by:  As directed    Diet - low sodium heart healthy    Complete by:  As directed    Increase activity slowly    Complete by:  As directed      Allergies as of 12/30/2016   No Known Allergies     Medication List    TAKE these medications   amoxicillin-clavulanate 875-125 MG tablet Commonly known as:  AUGMENTIN Take 1 tablet by mouth 2 (two) times daily.   aspirin 81 MG EC tablet Take 1 tablet (81 mg total) by mouth daily.   HYDROcodone-acetaminophen 5-325 MG tablet Commonly known as:  NORCO/VICODIN Take 1-2 tablets by mouth every 6 (six) hours as needed for moderate pain.   ibuprofen 200 MG tablet Commonly known as:  ADVIL,MOTRIN Take 800-1,000 mg by mouth every 6 (six) hours as needed for mild pain or moderate pain.   liver oil-zinc oxide 40 % ointment Commonly known as:  DESITIN Apply topically 2 (two) times daily.   sulfamethoxazole-trimethoprim 800-160 MG tablet Commonly known as:  BACTRIM DS,SEPTRA DS Take 1 tablet by mouth 2 (two) times daily.      Follow-up Information    [facility] Follow up in 2 week(s).   Contact information: [address] [place] [address]  [phone]           Sterling Bigiego Navy Belay, MD FACS

## 2017-01-01 ENCOUNTER — Telehealth: Payer: Self-pay

## 2017-01-01 NOTE — Telephone Encounter (Signed)
Hospital Follow-up call made to patient at this time. Left message for patient stating that I was calling to see how he was feeling and to get a follow up appointment scheduled for him to come in and see Dr. Everlene FarrierPabon with in the next two weeks. Patient was seen in the emergency department for abscess on neck.

## 2017-01-02 LAB — BODY FLUID CULTURE: CULTURE: NO GROWTH

## 2017-09-28 ENCOUNTER — Emergency Department
Admission: EM | Admit: 2017-09-28 | Discharge: 2017-09-28 | Disposition: A | Discharge status: Home / Self Care | Payer: Self-pay | Attending: Emergency Medicine | Admitting: Emergency Medicine

## 2017-09-28 ENCOUNTER — Encounter: Payer: Self-pay | Admitting: Emergency Medicine

## 2017-09-28 ENCOUNTER — Other Ambulatory Visit: Payer: Self-pay

## 2017-09-28 ENCOUNTER — Emergency Department: Payer: Self-pay

## 2017-09-28 DIAGNOSIS — R109 Unspecified abdominal pain: Secondary | ICD-10-CM | POA: Insufficient documentation

## 2017-09-28 DIAGNOSIS — Z79899 Other long term (current) drug therapy: Secondary | ICD-10-CM | POA: Insufficient documentation

## 2017-09-28 DIAGNOSIS — Z8673 Personal history of transient ischemic attack (TIA), and cerebral infarction without residual deficits: Secondary | ICD-10-CM | POA: Insufficient documentation

## 2017-09-28 DIAGNOSIS — F17210 Nicotine dependence, cigarettes, uncomplicated: Secondary | ICD-10-CM | POA: Insufficient documentation

## 2017-09-28 DIAGNOSIS — Z7982 Long term (current) use of aspirin: Secondary | ICD-10-CM | POA: Insufficient documentation

## 2017-09-28 LAB — BASIC METABOLIC PANEL
Anion gap: 9 (ref 5–15)
BUN: 13 mg/dL (ref 6–20)
CHLORIDE: 108 mmol/L (ref 101–111)
CO2: 23 mmol/L (ref 22–32)
CREATININE: 0.98 mg/dL (ref 0.61–1.24)
Calcium: 9.1 mg/dL (ref 8.9–10.3)
GFR calc Af Amer: >60 mL/min (ref >60)
GFR calc non Af Amer: >60 mL/min (ref >60)
GLUCOSE: 133 mg/dL — AB (ref 65–99)
POTASSIUM: 4 mmol/L (ref 3.5–5.1)
SODIUM: 140 mmol/L (ref 135–145)

## 2017-09-28 LAB — URINALYSIS, COMPLETE (UACMP) WITH MICROSCOPIC
Bacteria, UA: NONE SEEN
Bilirubin Urine: NEGATIVE
GLUCOSE, UA: NEGATIVE mg/dL
HGB URINE DIPSTICK: NEGATIVE
Ketones, ur: NEGATIVE mg/dL
Leukocytes, UA: NEGATIVE
Nitrite: NEGATIVE
PH: 7 (ref 5.0–8.0)
PROTEIN: NEGATIVE mg/dL
SPECIFIC GRAVITY, URINE: 1.008 (ref 1.005–1.030)
SQUAMOUS EPITHELIAL / LPF: NONE SEEN

## 2017-09-28 LAB — CBC WITH DIFFERENTIAL/PLATELET
Basophils Absolute: 0 10*3/uL (ref 0–0.1)
Basophils Relative: 0 %
EOS PCT: 4 %
Eosinophils Absolute: 0.3 10*3/uL (ref 0–0.7)
HCT: 45.2 % (ref 40.0–52.0)
Hemoglobin: 15.4 g/dL (ref 13.0–18.0)
LYMPHS ABS: 2.6 10*3/uL (ref 1.0–3.6)
Lymphocytes Relative: 35 %
MCH: 31.3 pg (ref 26.0–34.0)
MCHC: 34.1 g/dL (ref 32.0–36.0)
MCV: 91.7 fL (ref 80.0–100.0)
MONO ABS: 0.7 10*3/uL (ref 0.2–1.0)
MONOS PCT: 10 %
Neutro Abs: 3.8 10*3/uL (ref 1.4–6.5)
Neutrophils Relative %: 51 %
PLATELETS: 319 10*3/uL (ref 150–440)
RBC: 4.93 MIL/uL (ref 4.40–5.90)
RDW: 12.8 % (ref 11.5–14.5)
WBC: 7.5 10*3/uL (ref 3.8–10.6)

## 2017-09-28 MED ORDER — SODIUM CHLORIDE 0.9 % IV BOLUS (SEPSIS)
1000.0000 mL | Freq: Once | INTRAVENOUS | Status: AC
  Administered 2017-09-28: 1000 mL via INTRAVENOUS

## 2017-09-28 MED ORDER — KETOROLAC TROMETHAMINE 30 MG/ML IJ SOLN
15.0000 mg | INTRAMUSCULAR | Status: AC
  Administered 2017-09-28: 15 mg via INTRAVENOUS
  Filled 2017-09-28: qty 1

## 2017-09-28 MED ORDER — ONDANSETRON 4 MG PO TBDP: 4.0000 mg | ORAL_TABLET | Freq: Three times a day (TID) | ORAL | 0 refills | Status: DC | PRN

## 2017-09-28 MED ORDER — KETOROLAC TROMETHAMINE 10 MG PO TABS: 10.0000 mg | ORAL_TABLET | Freq: Four times a day (QID) | ORAL | 0 refills | Status: DC | PRN

## 2017-09-28 NOTE — ED Triage Notes (Signed)
Patient ambulatory to stat desk. Patient with complaint of left upper abdominal pain times two day. Patient reports that the pain has becoming worse. Patient denies nausea or vomiting.

## 2017-09-28 NOTE — ED Notes (Signed)
Patient transported to CT 

## 2017-09-28 NOTE — ED Triage Notes (Signed)
Pt arrives ambulatory with c/o left flank pain. Pt is in NAD.

## 2017-09-28 NOTE — Discharge Instructions (Signed)
Results for orders placed or performed during the hospital encounter of 09/28/17  Urinalysis, Complete w Microscopic  Result Value Ref Range   Color, Urine STRAW (A) YELLOW   APPearance CLEAR (A) CLEAR   Specific Gravity, Urine 1.008 1.005 - 1.030   pH 7.0 5.0 - 8.0   Glucose, UA NEGATIVE NEGATIVE mg/dL   Hgb urine dipstick NEGATIVE NEGATIVE   Bilirubin Urine NEGATIVE NEGATIVE   Ketones, ur NEGATIVE NEGATIVE mg/dL   Protein, ur NEGATIVE NEGATIVE mg/dL   Nitrite NEGATIVE NEGATIVE   Leukocytes, UA NEGATIVE NEGATIVE   RBC / HPF 0-5 0 - 5 RBC/hpf   WBC, UA 0-5 0 - 5 WBC/hpf   Bacteria, UA NONE SEEN NONE SEEN   Squamous Epithelial / LPF NONE SEEN NONE SEEN  Basic metabolic panel  Result Value Ref Range   Sodium 140 135 - 145 mmol/L   Potassium 4.0 3.5 - 5.1 mmol/L   Chloride 108 101 - 111 mmol/L   CO2 23 22 - 32 mmol/L   Glucose, Bld 133 (H) 65 - 99 mg/dL   BUN 13 6 - 20 mg/dL   Creatinine, Ser 1.610.98 0.61 - 1.24 mg/dL   Calcium 9.1 8.9 - 09.610.3 mg/dL   GFR calc non Af Amer >60 >60 mL/min   GFR calc Af Amer >60 >60 mL/min   Anion gap 9 5 - 15  CBC with Differential  Result Value Ref Range   WBC 7.5 3.8 - 10.6 K/uL   RBC 4.93 4.40 - 5.90 MIL/uL   Hemoglobin 15.4 13.0 - 18.0 g/dL   HCT 04.545.2 40.940.0 - 81.152.0 %   MCV 91.7 80.0 - 100.0 fL   MCH 31.3 26.0 - 34.0 pg   MCHC 34.1 32.0 - 36.0 g/dL   RDW 91.412.8 78.211.5 - 95.614.5 %   Platelets 319 150 - 440 K/uL   Neutrophils Relative % 51 %   Neutro Abs 3.8 1.4 - 6.5 K/uL   Lymphocytes Relative 35 %   Lymphs Abs 2.6 1.0 - 3.6 K/uL   Monocytes Relative 10 %   Monocytes Absolute 0.7 0.2 - 1.0 K/uL   Eosinophils Relative 4 %   Eosinophils Absolute 0.3 0 - 0.7 K/uL   Basophils Relative 0 %   Basophils Absolute 0.0 0 - 0.1 K/uL   Ct Renal Stone Study  Result Date: 09/28/2017 CLINICAL DATA:  Acute left-sided abdominal pain. EXAM: CT ABDOMEN AND PELVIS WITHOUT CONTRAST TECHNIQUE: Multidetector CT imaging of the abdomen and pelvis was performed  following the standard protocol without IV contrast. COMPARISON:  None. FINDINGS: Lower chest: No acute abnormality. Hepatobiliary: No focal liver abnormality is seen. No gallstones, gallbladder wall thickening, or biliary dilatation. Pancreas: Unremarkable. No pancreatic ductal dilatation or surrounding inflammatory changes. Spleen: Calcified splenic granulomata are noted. Adrenals/Urinary Tract: Adrenal glands appear normal. Small nonobstructive calculus is noted in lower pole collecting system of right kidney. No hydronephrosis or renal obstruction is noted. No ureteral calculi are noted. Urinary bladder is unremarkable. Stomach/Bowel: Stomach is within normal limits. Appendix appears normal. No evidence of bowel wall thickening, distention, or inflammatory changes. Vascular/Lymphatic: No significant vascular findings are present. No enlarged abdominal or pelvic lymph nodes. Reproductive: Prostate is unremarkable. Other: No abdominal wall hernia or abnormality. No abdominopelvic ascites. Musculoskeletal: No acute or significant osseous findings. IMPRESSION: Small nonobstructive right renal calculus. No hydronephrosis or renal obstruction is noted. No other significant abnormality seen in the abdomen or pelvis. Electronically Signed   By: Lupita RaiderJames  Green Jr, M.D.  On: 09/28/2017 20:34

## 2017-09-28 NOTE — ED Provider Notes (Signed)
[facility] Emergency Department Provider Note  ____________________________________________  Time seen: Approximately 10:43 PM  I have reviewed the triage vital signs and the nursing notes.   HISTORY  Chief Complaint Flank Pain    HPI Donald Becker is a 37 y.o. male who complains of left flank pain for the past 2 days, gradual onset, waxing and waning, constant. No aggravating or alleviating factors. No dysuria frequency urgency or hematuria. Does report that he often does not drink much fluids and is typically dehydrated. Rates pain as severe and sharp.   No recent trauma.  Past Medical History:  Diagnosis Date  . CVA (cerebral vascular accident) [facility])      Patient Active Problem List   Diagnosis Date Noted  . Abscess, neck 12/28/2016  . Neck abscess   . Altered mental status   . Pressure injury of skin 12/14/2016  . AKI (acute kidney injury) (HCC) 12/12/2016     Past Surgical History:  Procedure Laterality Date  . HERNIA REPAIR       Prior to Admission medications   Medication Sig Start Date End Date Taking? Authorizing Provider  aspirin EC 81 MG EC tablet Take 1 tablet (81 mg total) by mouth daily. 12/17/16   Adrian Saran, MD  HYDROcodone-acetaminophen (NORCO/VICODIN) 5-325 MG tablet Take 1-2 tablets by mouth every 6 (six) hours as needed for moderate pain. 12/30/16   Pabon, Diego F, MD  ibuprofen (ADVIL,MOTRIN) 200 MG tablet Take 800-1,000 mg by mouth every 6 (six) hours as needed for mild pain or moderate pain.    [provider]  ketorolac (TORADOL) 10 MG tablet Take 1 tablet (10 mg total) by mouth every 6 (six) hours as needed for moderate pain. 09/28/17   Sharman Cheek, MD  liver oil-zinc oxide (DESITIN) 40 % ointment Apply topically 2 (two) times daily. 12/14/16   Adrian Saran, MD  ondansetron (ZOFRAN ODT) 4 MG disintegrating tablet Take 1 tablet (4 mg total) by mouth every 8 (eight) hours as needed for nausea or vomiting.  09/28/17   Sharman Cheek, MD  sulfamethoxazole-trimethoprim (BACTRIM DS,SEPTRA DS) 800-160 MG tablet Take 1 tablet by mouth 2 (two) times daily. 12/30/16   Leafy Ro, MD     Allergies Patient has no known allergies.   No family history on file.  Social History Social History   Tobacco Use  . Smoking status: Current Every Day Smoker    Types: Cigarettes  . Smokeless tobacco: Never Used  Substance Use Topics  . Alcohol use: Yes    Comment: 2-3 days per week  . Drug use: No    Review of Systems  Constitutional:   No fever or chills.  ENT:   No sore throat. No rhinorrhea. Cardiovascular:   No chest pain or syncope. Respiratory:   No dyspnea or cough. Gastrointestinal:   Positive for left flank pain as above without vomiting and diarrhea.  Musculoskeletal:   Negative for focal pain or swelling All other systems reviewed and are negative except as documented above in ROS and HPI.  ____________________________________________   PHYSICAL EXAM:  VITAL SIGNS: ED Triage Vitals  Enc Vitals Group     BP 09/28/17 1953 (!) 175/97     Pulse Rate 09/28/17 1953 72     Resp 09/28/17 1953 (!) 2     Temp 09/28/17 1953 97.7 F (36.5 C)     Temp Source 09/28/17 1953 Oral     SpO2 09/28/17 1953 98 %     Weight  09/28/17 1954 235 lb (106.6 kg)     Height 09/28/17 1954 6\' 1"  (1.854 m)     Head Circumference --      Peak Flow --      Pain Score 09/28/17 1954 8     Pain Loc --      Pain Edu? --      Excl. in GC? --     Vital signs reviewed, nursing assessments reviewed.   Constitutional:   Alert and oriented. Well appearing and in no distress. Eyes:   No scleral icterus.  EOMI. No nystagmus. No conjunctival pallor. PERRL. ENT   Head:   Normocephalic and atraumatic.   Nose:   No congestion/rhinnorhea.    Mouth/Throat:   MMM, no pharyngeal erythema. No peritonsillar mass.    Neck:   No meningismus. Full ROM. Hematological/Lymphatic/Immunilogical:   No cervical  lymphadenopathy. Cardiovascular:   RRR. Symmetric bilateral radial and DP pulses.  No murmurs.  Respiratory:   Normal respiratory effort without tachypnea/retractions. Breath sounds are clear and equal bilaterally. No wheezes/rales/rhonchi. Gastrointestinal:   Soft with left-sided abdominal tenderness. No splenomegaly.. Non distended. There is no CVA tenderness.  No rebound, rigidity, or guarding. Genitourinary:   deferred Musculoskeletal:   Normal range of motion in all extremities. No joint effusions.  No lower extremity tenderness.  No edema. Neurologic:   Normal speech and language.  Motor grossly intact. No acute focal neurologic deficits are appreciated.  Skin:    Skin is warm, dry and intact. No rash noted.  No petechiae, purpura, or bullae.  ____________________________________________    LABS (pertinent positives/negatives) (all labs ordered are listed, but only abnormal results are displayed) Labs Reviewed  URINALYSIS, COMPLETE (UACMP) WITH MICROSCOPIC - Abnormal; Notable for the following components:      Result Value   Color, Urine STRAW (*)    APPearance CLEAR (*)    All other components within normal limits  BASIC METABOLIC PANEL - Abnormal; Notable for the following components:   Glucose, Bld 133 (*)    All other components within normal limits  CBC WITH DIFFERENTIAL/PLATELET   ____________________________________________   EKG    ____________________________________________    RADIOLOGY  Ct Renal Stone Study  Result Date: 09/28/2017 CLINICAL DATA:  Acute left-sided abdominal pain. EXAM: CT ABDOMEN AND PELVIS WITHOUT CONTRAST TECHNIQUE: Multidetector CT imaging of the abdomen and pelvis was performed following the standard protocol without IV contrast. COMPARISON:  None. FINDINGS: Lower chest: No acute abnormality. Hepatobiliary: No focal liver abnormality is seen. No gallstones, gallbladder wall thickening, or biliary dilatation. Pancreas: Unremarkable. No  pancreatic ductal dilatation or surrounding inflammatory changes. Spleen: Calcified splenic granulomata are noted. Adrenals/Urinary Tract: Adrenal glands appear normal. Small nonobstructive calculus is noted in lower pole collecting system of right kidney. No hydronephrosis or renal obstruction is noted. No ureteral calculi are noted. Urinary bladder is unremarkable. Stomach/Bowel: Stomach is within normal limits. Appendix appears normal. No evidence of bowel wall thickening, distention, or inflammatory changes. Vascular/Lymphatic: No significant vascular findings are present. No enlarged abdominal or pelvic lymph nodes. Reproductive: Prostate is unremarkable. Other: No abdominal wall hernia or abnormality. No abdominopelvic ascites. Musculoskeletal: No acute or significant osseous findings. IMPRESSION: Small nonobstructive right renal calculus. No hydronephrosis or renal obstruction is noted. No other significant abnormality seen in the abdomen or pelvis. Electronically Signed   By: Lupita Raider, M.D.   On: 09/28/2017 20:34    ____________________________________________   PROCEDURES Procedures  ____________________________________________    CLINICAL IMPRESSION / ASSESSMENT AND  PLAN / ED COURSE  Pertinent labs & imaging results that were available during my care of the patient were reviewed by me and considered in my medical decision making (see chart for details).     Clinical Course as of Sep 28 2241  Fri Sep 28, 2017  2007 P/w left flank pain with tenderness, concerning for ureteral stone, possible hydro. Has a h/o AKI, will check labs and give IVF for hydration .  [PS]  2055 CT abd/pelvis shows right renal calculus, no ureteral stones or obstruction. Overall unremarkable. Spleen NAD.   [PS]  2207 Feels better. Workup negative. Low suspicion of renal infarct, AAA, or dissection. Will DC home with sx management, f/u pcp. Return precautions.   [PS]    Clinical Course User Index [PS]  Sharman CheekStafford, Joshu Furukawa, MD     ____________________________________________   FINAL CLINICAL IMPRESSION(S) / ED DIAGNOSES    Final diagnoses:  Left flank pain     ED Discharge Orders        Ordered    ketorolac (TORADOL) 10 MG tablet  Every 6 hours PRN     09/28/17 2243    ondansetron (ZOFRAN ODT) 4 MG disintegrating tablet  Every 8 hours PRN     09/28/17 2243      Portions of this note were generated with dragon dictation software. Dictation errors may occur despite best attempts at proofreading.    Sharman CheekStafford, [place], MD 09/28/17 2245

## 2018-04-07 IMAGING — MR MR HEAD WO/W CM
11 of 14 series · 27 of 48 positions shown · IV contrast (multihance)
Comparison: Head CT from yesterday

CLINICAL DATA: Presented with altered mental status and
rhabdomyolysis. Found unresponsive in [place].

EXAM:
MRI HEAD WITHOUT AND WITH CONTRAST
MRA HEAD WITHOUT CONTRAST
MRA NECK WITHOUT AND WITH CONTRAST
TECHNIQUE: Multiplanar, multiecho pulse sequences of the brain and surrounding
structures were obtained without and with intravenous contrast.
Angiographic images of the Circle of Willis were obtained using MRA
technique without intravenous contrast. Angiographic images of the
neck were obtained using MRA technique without and with intravenous
contrast. Carotid stenosis measurements (when applicable) are
obtained utilizing NASCET criteria, using the distal internal
carotid diameter as the denominator.
CONTRAST:  20mL MULTIHANCE GADOBENATE DIMEGLUMINE 529 MG/ML IV SOLN

[Series 2: T1 · sagittal · 5.0mm · 0.45mm/px · 3 of 31 slices shown (1 of 2)]
[im 1/31]
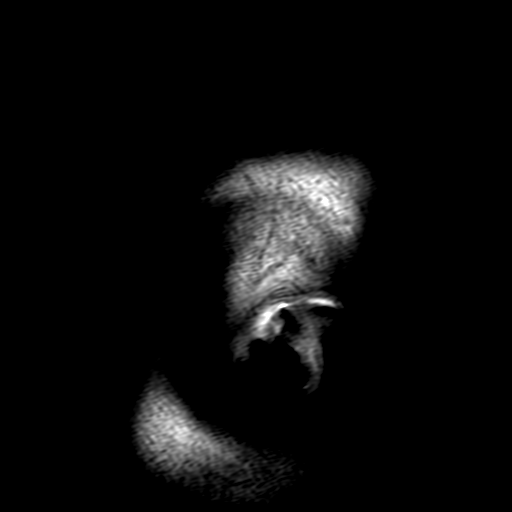
[im 16/31]
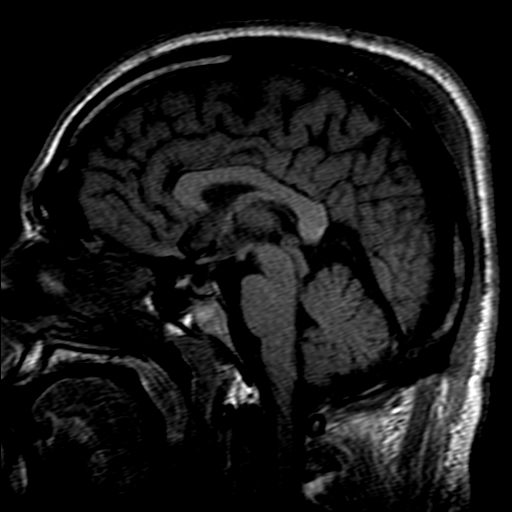
[im 31/31]
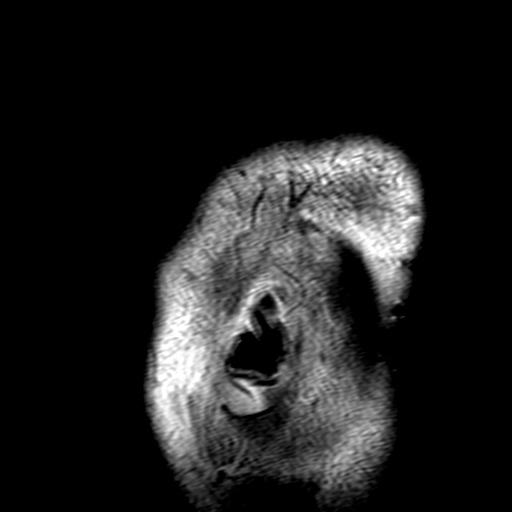

[Series 4: DWI · axial · 4.0mm · 0.94mm/px · z∈[-97,+81]mm · 3 of 46 slices shown (1 of 2)]
[im 1/46]
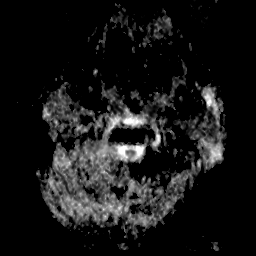
[im 23/46]
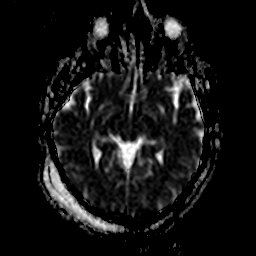
[im 46/46]
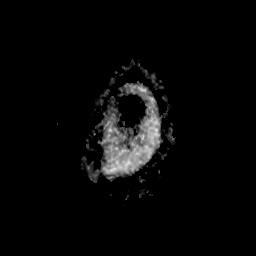

[Series 6: DWI · coronal · 5.0mm · 1.80mm/px · 3 of 40 slices shown (2 of 2)]
[im 1/40]
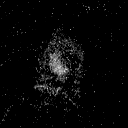
[im 20/40]
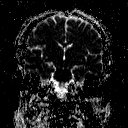
[im 40/40]
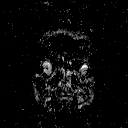

[Series 7: T2 · axial · 5.0mm · 0.45mm/px · z∈[-70,+94]mm · 2 of 27 slices shown (1 of 3)]
[im 1/27]
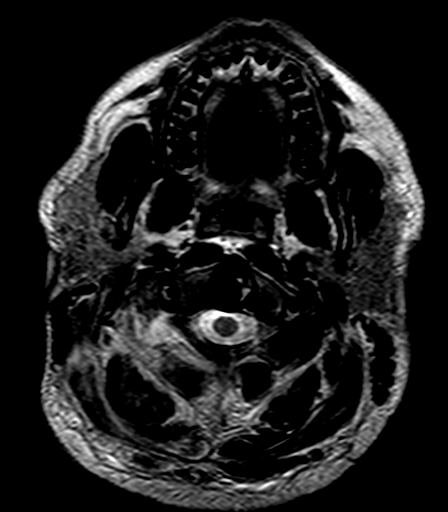
[im 27/27]
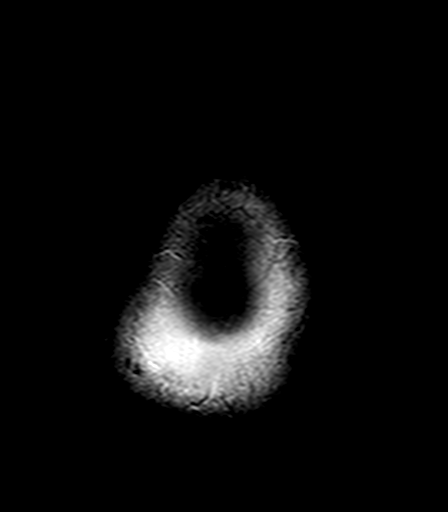

[Series 8: FLAIR · axial · 5.0mm · 0.90mm/px · z∈[-71,+93]mm · 2 of 27 slices shown]
[im 1/27]
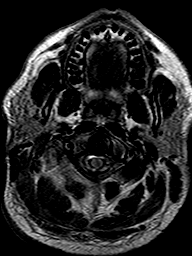
[im 27/27]
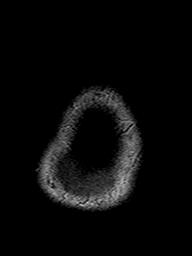

[Series 9: T2 · axial · 5.0mm · 0.45mm/px · z∈[-70,+94]mm · 2 of 27 slices shown (2 of 3)]
[im 1/27]
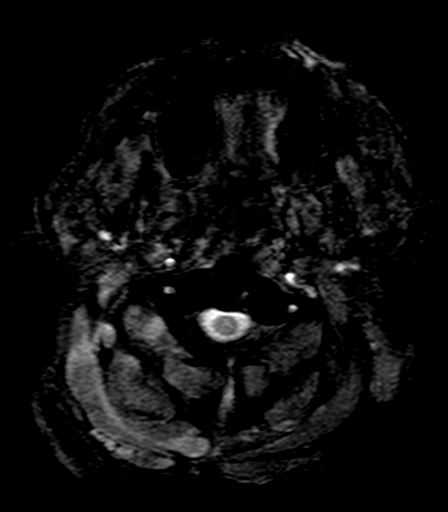
[im 27/27]
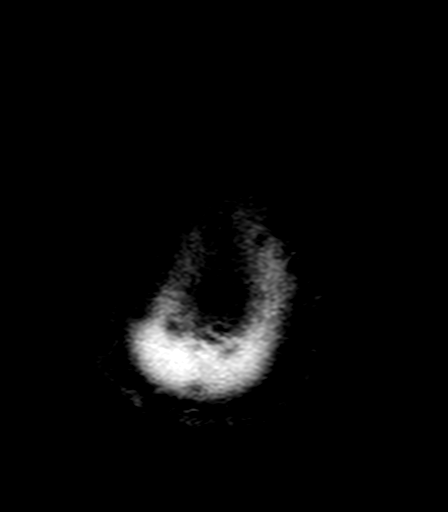

[Series 12: T1 · axial · 3.0mm · 0.45mm/px · 1 of 60 slices shown (2 of 2)]
[im 1/60]
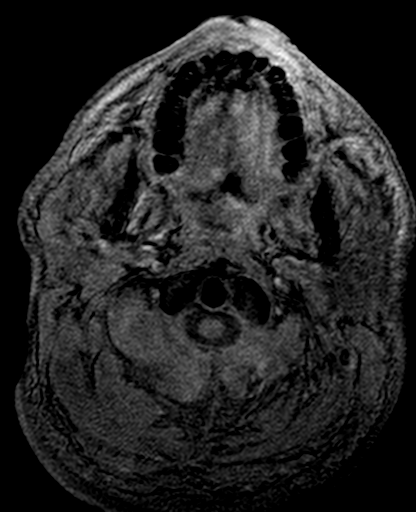

[Series 13: T2 · coronal · 5.0mm · 0.45mm/px · 2 of 31 slices shown (3 of 3)]
[im 1/31]
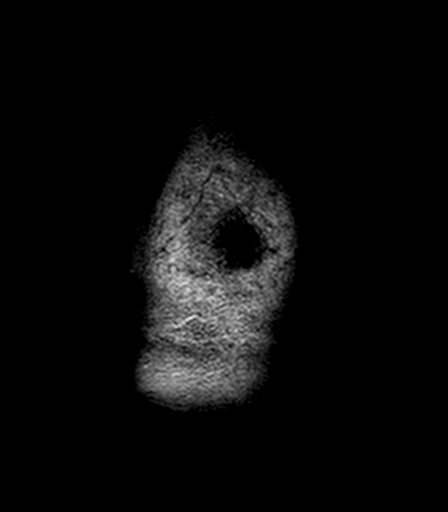
[im 31/31]
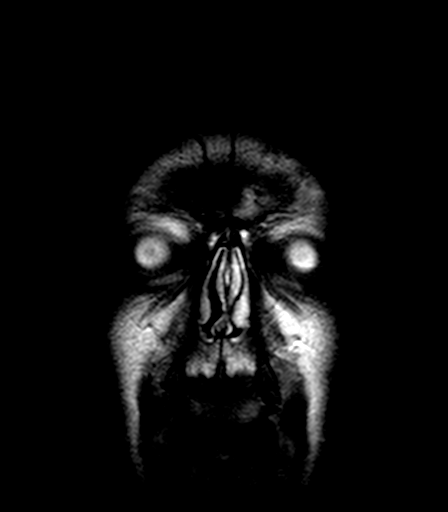

[Series 20: T1 post-contrast · sagittal · 5.0mm · 0.45mm/px · 2 of 29 slices shown (1 of 3)]
[im 1/29]
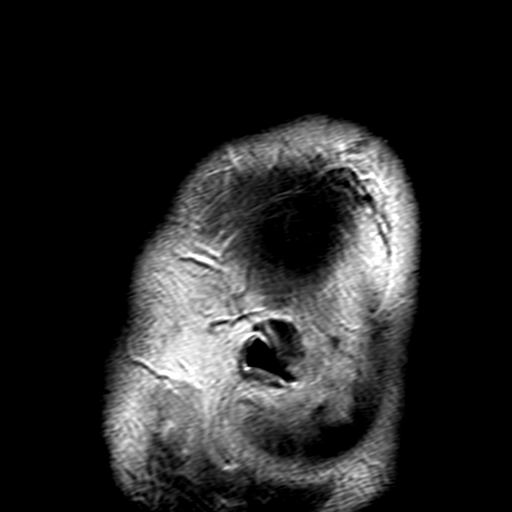
[im 29/29]
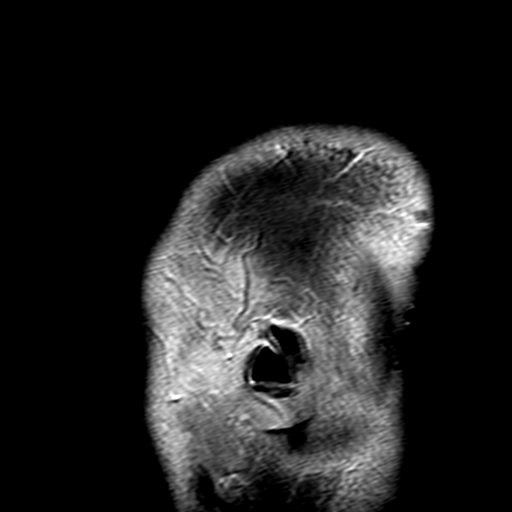

[Series 21: T1 post-contrast · axial · 3.0mm · 0.45mm/px · z∈[-62,+109]mm · 5 of 60 slices shown (2 of 3)]
[im 1/60]
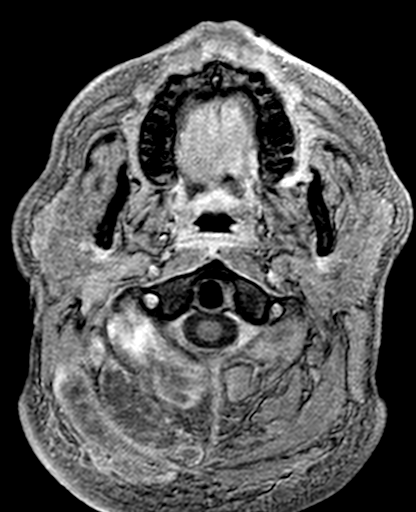
[im 15/60]
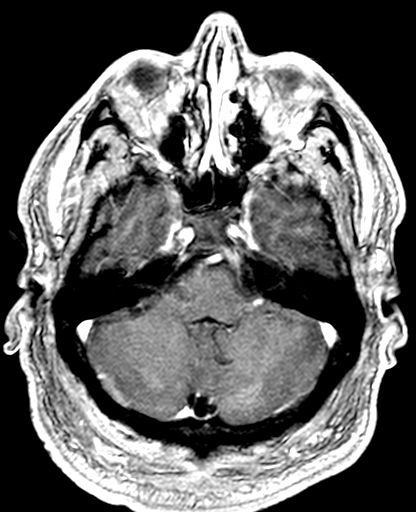
[im 30/60]
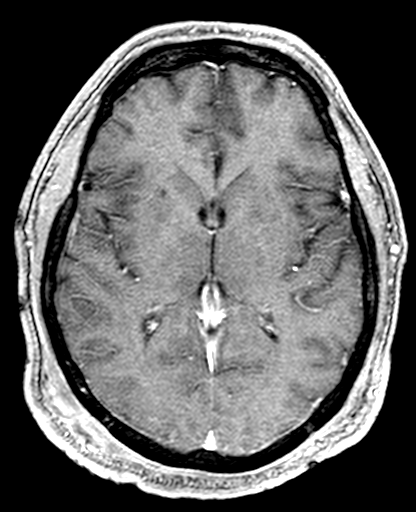
[im 45/60]
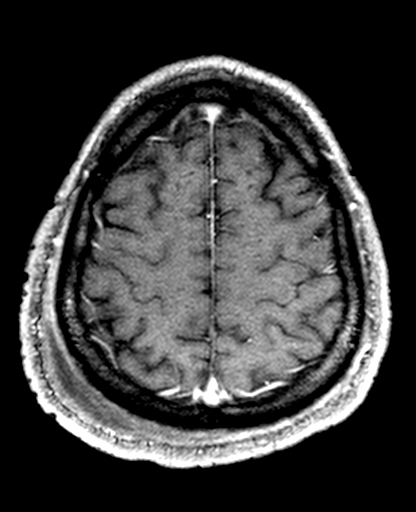
[im 60/60]
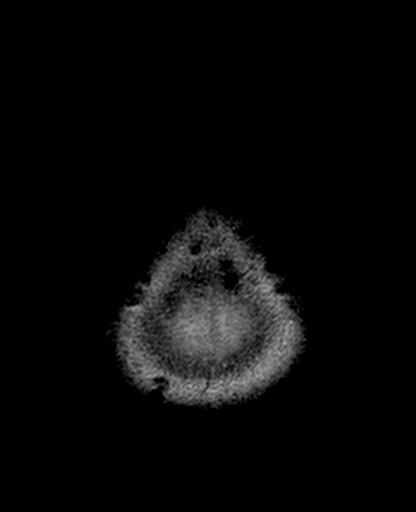

[Series 22: T1 post-contrast · coronal · 5.0mm · 0.45mm/px · 2 of 33 slices shown (3 of 3)]
[im 1/33]
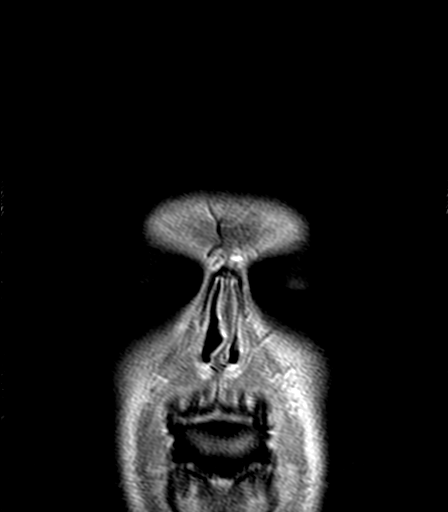
[im 33/33]
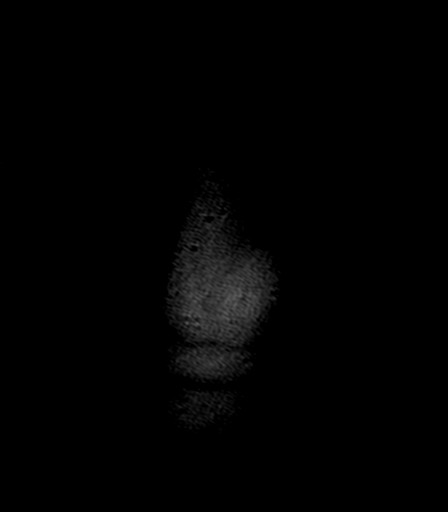

[27 of 48 positions shown; findings below may reference images not displayed]

FINDINGS: MRI HEAD FINDINGS

Brain: Symmetric restricted diffusion in the hippocampus, occipital
cortex, globus pallidus, and superficial cerebellum. These are
primarily metabolically active areas, and susceptible to hypoxic
ischemic injury, hypoglycemia, or toxic insult -including carbon
monoxide.

No hemorrhage or hydrocephalus.  No masslike findings.

Vascular: Arterial findings below. Normal dural venous sinus flow
voids.

Skull and upper cervical spine: The upper right neck musculature is
T2 hyperintense, expanded, and nonenhancing - consistent with not
myonecrosis and correlating with history of rhabdomyolysis. There is
a large subgaleal scalp collection mainly posteriorly and right
eccentric.

Sinuses/Orbits: Mucosal thickening in the right mastoid air cells.
Mild patchy mucosal thickening in the paranasal sinuses.

MRA HEAD FINDINGS

Standard intracranial arterial anatomy. Mild vertebrobasilar
tortuosity. There is no stenosis, beading, or major branch
occlusion. Negative for aneurysm.

MRA NECK FINDINGS

Time-of-flight imaging shows antegrade flow in both carotid and
vertebral circulations.

Postcontrast imaging does not cover the entire left vertebral
artery, but the missed segment is normal on time-of-flight.

Imaging is motion degraded, but there is no indication of stenosis
or dissection. No beading noted.
IMPRESSION: 1. Symmetric injury/infarct of the hippocampus, globus pallidus,
cerebellum, and occipital cortex. This is the pattern of hypoxic
ischemic injury or toxic/metabolic insult (hypoglycemia, carbon
monoxide, ect).
2. Extensive necrosis of right posterior cervical musculature,
correlating with history of rhabdomyolysis. Contiguous sizable
subgaleal fluid collection continuing to the vertex.
3. Negative intracranial and cervical MRA.

## 2018-11-19 ENCOUNTER — Other Ambulatory Visit: Payer: Self-pay

## 2018-11-19 ENCOUNTER — Emergency Department: Payer: Medicaid Other

## 2018-11-19 ENCOUNTER — Inpatient Hospital Stay
Admission: EM | Admit: 2018-11-19 | Discharge: 2018-11-22 | DRG: 958 | Disposition: A | Discharge status: Home / Self Care | Payer: Medicaid Other | Attending: Internal Medicine | Admitting: Internal Medicine

## 2018-11-19 ENCOUNTER — Encounter: Payer: Self-pay | Admitting: Emergency Medicine

## 2018-11-19 DIAGNOSIS — G992 Myelopathy in diseases classified elsewhere: Secondary | ICD-10-CM | POA: Diagnosis present

## 2018-11-19 DIAGNOSIS — S13141A Dislocation of C3/C4 cervical vertebrae, initial encounter: Principal | ICD-10-CM | POA: Diagnosis present

## 2018-11-19 DIAGNOSIS — S0990XA Unspecified injury of head, initial encounter: Secondary | ICD-10-CM | POA: Diagnosis present

## 2018-11-19 DIAGNOSIS — S13151A Dislocation of C4/C5 cervical vertebrae, initial encounter: Secondary | ICD-10-CM | POA: Diagnosis present

## 2018-11-19 DIAGNOSIS — S069X1A Unspecified intracranial injury with loss of consciousness of 30 minutes or less, initial encounter: Secondary | ICD-10-CM | POA: Diagnosis present

## 2018-11-19 DIAGNOSIS — W16522A Jumping or diving into swimming pool striking bottom causing other injury, initial encounter: Secondary | ICD-10-CM | POA: Diagnosis present

## 2018-11-19 DIAGNOSIS — Z7982 Long term (current) use of aspirin: Secondary | ICD-10-CM | POA: Diagnosis not present

## 2018-11-19 DIAGNOSIS — F17210 Nicotine dependence, cigarettes, uncomplicated: Secondary | ICD-10-CM | POA: Diagnosis present

## 2018-11-19 DIAGNOSIS — S13161A Dislocation of C5/C6 cervical vertebrae, initial encounter: Secondary | ICD-10-CM | POA: Diagnosis present

## 2018-11-19 DIAGNOSIS — Z419 Encounter for procedure for purposes other than remedying health state, unspecified: Secondary | ICD-10-CM

## 2018-11-19 DIAGNOSIS — S14103A Unspecified injury at C3 level of cervical spinal cord, initial encounter: Secondary | ICD-10-CM | POA: Diagnosis present

## 2018-11-19 DIAGNOSIS — S199XXA Unspecified injury of neck, initial encounter: Secondary | ICD-10-CM

## 2018-11-19 DIAGNOSIS — M4802 Spinal stenosis, cervical region: Secondary | ICD-10-CM | POA: Diagnosis present

## 2018-11-19 DIAGNOSIS — M2578 Osteophyte, vertebrae: Secondary | ICD-10-CM | POA: Diagnosis present

## 2018-11-19 DIAGNOSIS — S14109A Unspecified injury at unspecified level of cervical spinal cord, initial encounter: Secondary | ICD-10-CM

## 2018-11-19 DIAGNOSIS — Z981 Arthrodesis status: Secondary | ICD-10-CM

## 2018-11-19 DIAGNOSIS — Z8673 Personal history of transient ischemic attack (TIA), and cerebral infarction without residual deficits: Secondary | ICD-10-CM | POA: Diagnosis not present

## 2018-11-19 LAB — BASIC METABOLIC PANEL
Anion gap: 13 (ref 5–15)
BUN: 11 mg/dL (ref 6–20)
CO2: 19 mmol/L — ABNORMAL LOW (ref 22–32)
Calcium: 9.1 mg/dL (ref 8.9–10.3)
Chloride: 105 mmol/L (ref 98–111)
Creatinine, Ser: 0.94 mg/dL (ref 0.61–1.24)
GFR calc Af Amer: >60 mL/min (ref >60)
GFR calc non Af Amer: >60 mL/min (ref >60)
Glucose, Bld: 96 mg/dL (ref 70–99)
Potassium: 3.9 mmol/L (ref 3.5–5.1)
Sodium: 137 mmol/L (ref 135–145)

## 2018-11-19 LAB — CBC
HCT: 45 % (ref 39.0–52.0)
Hemoglobin: 15.7 g/dL (ref 13.0–17.0)
MCH: 32.2 pg (ref 26.0–34.0)
MCHC: 34.9 g/dL (ref 30.0–36.0)
MCV: 92.2 fL (ref 80.0–100.0)
Platelets: 337 10*3/uL (ref 150–400)
RBC: 4.88 MIL/uL (ref 4.22–5.81)
RDW: 12.2 % (ref 11.5–15.5)
WBC: 11.2 10*3/uL — ABNORMAL HIGH (ref 4.0–10.5)
nRBC: 0 % (ref 0.0–0.2)

## 2018-11-19 MED ORDER — FENTANYL CITRATE (PF) 100 MCG/2ML IJ SOLN
50.0000 ug | Freq: Once | INTRAMUSCULAR | Status: AC
  Administered 2018-11-19: 50 ug via INTRAVENOUS
  Filled 2018-11-19: qty 2

## 2018-11-19 NOTE — ED Triage Notes (Addendum)
Pt lying supine in back seat of vehicle, A&Ox3; +ETOH (admits to 3beers); pt reports PTA jumped off ladder into 61ft pool hitting head; was unable to get out of pool unassisted and unable to bear weight and currently having pain to right shoulder and decreased sensation to extremities but movement noted to all 4; charge nurse called; c-collar applied while maintaining spinal immobilization, and slid onto backboard on gurney using slide board with several assist; pt taken immed to exam room for further eval
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
## 2018-11-19 NOTE — ED Notes (Signed)
Patient transported to MRI 
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
## 2018-11-20 ENCOUNTER — Encounter: Payer: Self-pay | Admitting: Radiology

## 2018-11-20 ENCOUNTER — Inpatient Hospital Stay: Payer: Medicaid Other

## 2018-11-20 ENCOUNTER — Inpatient Hospital Stay: Payer: Medicaid Other | Admitting: Anesthesiology

## 2018-11-20 ENCOUNTER — Encounter
Admission: EM | Disposition: A | Discharge status: Home / Self Care | Payer: Self-pay | Source: Home / Self Care | Attending: Internal Medicine

## 2018-11-20 DIAGNOSIS — S14109A Unspecified injury at unspecified level of cervical spinal cord, initial encounter: Secondary | ICD-10-CM

## 2018-11-20 HISTORY — PX: ANTERIOR CERVICAL CORPECTOMY: SHX1159

## 2018-11-20 HISTORY — PX: ANTERIOR CERVICAL DECOMPRESSION/DISCECTOMY FUSION 4 LEVELS: SHX5556

## 2018-11-20 LAB — BASIC METABOLIC PANEL
Anion gap: 12 (ref 5–15)
BUN: 13 mg/dL (ref 6–20)
CO2: 22 mmol/L (ref 22–32)
Calcium: 8.9 mg/dL (ref 8.9–10.3)
Chloride: 104 mmol/L (ref 98–111)
Creatinine, Ser: 0.87 mg/dL (ref 0.61–1.24)
GFR calc Af Amer: >60 mL/min (ref >60)
GFR calc non Af Amer: >60 mL/min (ref >60)
Glucose, Bld: 96 mg/dL (ref 70–99)
Potassium: 3.6 mmol/L (ref 3.5–5.1)
Sodium: 138 mmol/L (ref 135–145)

## 2018-11-20 LAB — CBC
HCT: 42.9 % (ref 39.0–52.0)
Hemoglobin: 15.4 g/dL (ref 13.0–17.0)
MCH: 32.6 pg (ref 26.0–34.0)
MCHC: 35.9 g/dL (ref 30.0–36.0)
MCV: 90.7 fL (ref 80.0–100.0)
Platelets: 350 10*3/uL (ref 150–400)
RBC: 4.73 MIL/uL (ref 4.22–5.81)
RDW: 12.4 % (ref 11.5–15.5)
WBC: 12.6 10*3/uL — ABNORMAL HIGH (ref 4.0–10.5)
nRBC: 0 % (ref 0.0–0.2)

## 2018-11-20 LAB — URINE DRUG SCREEN, QUALITATIVE (ARMC ONLY)
Amphetamines, Ur Screen: NOT DETECTED
Barbiturates, Ur Screen: NOT DETECTED
Benzodiazepine, Ur Scrn: NOT DETECTED
Cannabinoid 50 Ng, Ur ~~LOC~~: NOT DETECTED
Cocaine Metabolite,Ur ~~LOC~~: NOT DETECTED
MDMA (Ecstasy)Ur Screen: NOT DETECTED
Methadone Scn, Ur: NOT DETECTED
Opiate, Ur Screen: NOT DETECTED
Phencyclidine (PCP) Ur S: NOT DETECTED
Tricyclic, Ur Screen: NOT DETECTED

## 2018-11-20 LAB — TYPE AND SCREEN
ABO/RH(D): O POS
Antibody Screen: NEGATIVE

## 2018-11-20 LAB — ETHANOL: Alcohol, Ethyl (B): 21 mg/dL — ABNORMAL HIGH (ref <10)

## 2018-11-20 LAB — MRSA PCR SCREENING: MRSA by PCR: NEGATIVE

## 2018-11-20 LAB — PHOSPHORUS: Phosphorus: 3.9 mg/dL (ref 2.5–4.6)

## 2018-11-20 LAB — GLUCOSE, CAPILLARY
Glucose-Capillary: 82 mg/dL (ref 70–99)
Glucose-Capillary: 121 mg/dL — ABNORMAL HIGH (ref 70–99)

## 2018-11-20 LAB — MAGNESIUM: Magnesium: 2.3 mg/dL (ref 1.7–2.4)

## 2018-11-20 SURGERY — ANTERIOR CERVICAL DECOMPRESSION/DISCECTOMY FUSION 4 LEVELS
Anesthesia: General | Site: Neck

## 2018-11-20 MED ORDER — MIDAZOLAM HCL 5 MG/5ML IJ SOLN
INTRAMUSCULAR | Status: DC | PRN
  Administered 2018-11-20: 2 mg via INTRAVENOUS

## 2018-11-20 MED ORDER — FENTANYL CITRATE (PF) 100 MCG/2ML IJ SOLN
INTRAMUSCULAR | Status: AC
  Administered 2018-11-20: 50 ug via INTRAVENOUS
  Filled 2018-11-20: qty 2

## 2018-11-20 MED ORDER — BISACODYL 5 MG PO TBEC: 5.0000 mg | DELAYED_RELEASE_TABLET | Freq: Every day | ORAL | Status: DC | PRN

## 2018-11-20 MED ORDER — REMIFENTANIL HCL 1 MG IV SOLR
INTRAVENOUS | Status: DC | PRN
  Administered 2018-11-20: .1 ug/kg/min via INTRAVENOUS

## 2018-11-20 MED ORDER — PHENYLEPHRINE HCL (PRESSORS) 10 MG/ML IV SOLN
INTRAVENOUS | Status: AC
  Filled 2018-11-20: qty 1

## 2018-11-20 MED ORDER — PROPOFOL 10 MG/ML IV BOLUS
INTRAVENOUS | Status: AC
  Filled 2018-11-20: qty 20

## 2018-11-20 MED ORDER — HYDROMORPHONE HCL 1 MG/ML IJ SOLN: 0.5000 mg | INTRAMUSCULAR | Status: DC | PRN

## 2018-11-20 MED ORDER — LACTATED RINGERS IV SOLN
INTRAVENOUS | Status: DC | PRN
  Administered 2018-11-20: 13:00:00 via INTRAVENOUS

## 2018-11-20 MED ORDER — PHENYLEPHRINE HCL (PRESSORS) 10 MG/ML IV SOLN
INTRAVENOUS | Status: DC | PRN
  Administered 2018-11-20 (×4): 100 ug via INTRAVENOUS

## 2018-11-20 MED ORDER — OXYCODONE HCL 5 MG PO TABS: 5.0000 mg | ORAL_TABLET | Freq: Once | ORAL | Status: DC | PRN

## 2018-11-20 MED ORDER — REMIFENTANIL HCL 1 MG IV SOLR
INTRAVENOUS | Status: AC
  Filled 2018-11-20: qty 1000

## 2018-11-20 MED ORDER — CEFAZOLIN SODIUM 1 G IJ SOLR
INTRAMUSCULAR | Status: AC
  Filled 2018-11-20: qty 10

## 2018-11-20 MED ORDER — SODIUM CHLORIDE 0.9 % IV SOLN
INTRAVENOUS | Status: AC
  Administered 2018-11-20: 01:00:00 via INTRAVENOUS

## 2018-11-20 MED ORDER — SENNA 8.6 MG PO TABS
1.0000 | ORAL_TABLET | Freq: Two times a day (BID) | ORAL | Status: DC
  Administered 2018-11-20 – 2018-11-22 (×4): 8.6 mg via ORAL
  Filled 2018-11-20 (×4): qty 1

## 2018-11-20 MED ORDER — SODIUM CHLORIDE 0.9% FLUSH
3.0000 mL | Freq: Two times a day (BID) | INTRAVENOUS | Status: DC
  Administered 2018-11-20 – 2018-11-22 (×3): 3 mL via INTRAVENOUS

## 2018-11-20 MED ORDER — MIDAZOLAM HCL 2 MG/2ML IJ SOLN
INTRAMUSCULAR | Status: AC
  Filled 2018-11-20: qty 2

## 2018-11-20 MED ORDER — ROCURONIUM BROMIDE 50 MG/5ML IV SOLN
INTRAVENOUS | Status: AC
  Filled 2018-11-20: qty 1

## 2018-11-20 MED ORDER — PROPOFOL 10 MG/ML IV BOLUS
INTRAVENOUS | Status: DC | PRN
  Administered 2018-11-20: 150 mg via INTRAVENOUS

## 2018-11-20 MED ORDER — ENOXAPARIN SODIUM 40 MG/0.4ML ~~LOC~~ SOLN
40.0000 mg | SUBCUTANEOUS | Status: DC
  Administered 2018-11-20: 40 mg via SUBCUTANEOUS
  Filled 2018-11-20: qty 0.4

## 2018-11-20 MED ORDER — OXYCODONE HCL 5 MG PO TABS: 5.0000 mg | ORAL_TABLET | ORAL | Status: DC | PRN

## 2018-11-20 MED ORDER — NOREPINEPHRINE BITARTRATE 1 MG/ML IV SOLN
INTRAVENOUS | Status: AC
  Filled 2018-11-20: qty 4

## 2018-11-20 MED ORDER — FENTANYL CITRATE (PF) 100 MCG/2ML IJ SOLN
INTRAMUSCULAR | Status: DC | PRN
  Administered 2018-11-20 (×2): 50 ug via INTRAVENOUS

## 2018-11-20 MED ORDER — METHOCARBAMOL 1000 MG/10ML IJ SOLN
500.0000 mg | Freq: Four times a day (QID) | INTRAVENOUS | Status: DC
  Filled 2018-11-20: qty 5

## 2018-11-20 MED ORDER — FENTANYL CITRATE (PF) 250 MCG/5ML IJ SOLN
INTRAMUSCULAR | Status: AC
  Filled 2018-11-20: qty 5

## 2018-11-20 MED ORDER — SUGAMMADEX SODIUM 200 MG/2ML IV SOLN
INTRAVENOUS | Status: DC | PRN
  Administered 2018-11-20: 230 mg via INTRAVENOUS

## 2018-11-20 MED ORDER — THROMBIN 5000 UNITS EX SOLR
CUTANEOUS | Status: AC
  Filled 2018-11-20: qty 5000

## 2018-11-20 MED ORDER — SODIUM CHLORIDE 0.9% FLUSH: 3.0000 mL | INTRAVENOUS | Status: DC | PRN

## 2018-11-20 MED ORDER — ONDANSETRON HCL 4 MG/2ML IJ SOLN: 4.0000 mg | Freq: Four times a day (QID) | INTRAMUSCULAR | Status: DC | PRN

## 2018-11-20 MED ORDER — NOREPINEPHRINE 4 MG/250ML-% IV SOLN
0.0000 ug/min | INTRAVENOUS | Status: DC
  Administered 2018-11-20: 10 ug/min via INTRAVENOUS
  Filled 2018-11-20: qty 250

## 2018-11-20 MED ORDER — SUCCINYLCHOLINE CHLORIDE 20 MG/ML IJ SOLN
INTRAMUSCULAR | Status: DC | PRN
  Administered 2018-11-20: 120 mg via INTRAVENOUS

## 2018-11-20 MED ORDER — ROCURONIUM BROMIDE 100 MG/10ML IV SOLN
INTRAVENOUS | Status: DC | PRN
  Administered 2018-11-20: 15 mg via INTRAVENOUS

## 2018-11-20 MED ORDER — SODIUM CHLORIDE 0.9% IV SOLUTION
Freq: Once | INTRAVENOUS | Status: AC
  Administered 2018-11-20: 16:00:00 225 mL via INTRAVENOUS
  Administered 2018-11-20: 18:00:00 1000 mL via INTRAVENOUS
  Administered 2018-11-20: 16:00:00 via INTRAVENOUS

## 2018-11-20 MED ORDER — ASPIRIN EC 81 MG PO TBEC
81.0000 mg | DELAYED_RELEASE_TABLET | Freq: Every day | ORAL | Status: DC
Start: 2018-11-20 — End: 2018-11-20

## 2018-11-20 MED ORDER — CEFAZOLIN SODIUM-DEXTROSE 2-3 GM-%(50ML) IV SOLR
INTRAVENOUS | Status: DC | PRN
  Administered 2018-11-20 (×2): 2 g via INTRAVENOUS

## 2018-11-20 MED ORDER — OXYCODONE HCL 5 MG/5ML PO SOLN: 5.0000 mg | Freq: Once | ORAL | Status: DC | PRN

## 2018-11-20 MED ORDER — ACETAMINOPHEN 325 MG PO TABS
650.0000 mg | ORAL_TABLET | ORAL | Status: DC | PRN
  Administered 2018-11-22: 650 mg via ORAL
  Filled 2018-11-20: qty 2

## 2018-11-20 MED ORDER — ACETAMINOPHEN 650 MG RE SUPP: 650.0000 mg | Freq: Four times a day (QID) | RECTAL | Status: DC | PRN

## 2018-11-20 MED ORDER — SODIUM CHLORIDE 0.9 % IV SOLN
INTRAVENOUS | Status: DC
  Administered 2018-11-20 – 2018-11-21 (×2): via INTRAVENOUS

## 2018-11-20 MED ORDER — DEXAMETHASONE SODIUM PHOSPHATE 10 MG/ML IJ SOLN
INTRAMUSCULAR | Status: DC | PRN
  Administered 2018-11-20: 10 mg via INTRAVENOUS

## 2018-11-20 MED ORDER — ACETAMINOPHEN 10 MG/ML IV SOLN
INTRAVENOUS | Status: AC
  Administered 2018-11-20: 1000 mg
  Filled 2018-11-20: qty 100

## 2018-11-20 MED ORDER — SUCCINYLCHOLINE CHLORIDE 20 MG/ML IJ SOLN
INTRAMUSCULAR | Status: AC
  Filled 2018-11-20: qty 1

## 2018-11-20 MED ORDER — EPHEDRINE SULFATE 50 MG/ML IJ SOLN
INTRAMUSCULAR | Status: DC | PRN
  Administered 2018-11-20: 5 mg via INTRAVENOUS

## 2018-11-20 MED ORDER — ONDANSETRON HCL 4 MG PO TABS: 4.0000 mg | ORAL_TABLET | Freq: Four times a day (QID) | ORAL | Status: DC | PRN

## 2018-11-20 MED ORDER — FENTANYL CITRATE (PF) 100 MCG/2ML IJ SOLN
50.0000 ug | INTRAMUSCULAR | Status: DC | PRN
  Administered 2018-11-20 – 2018-11-21 (×4): 50 ug via INTRAVENOUS
  Filled 2018-11-20 (×5): qty 2

## 2018-11-20 MED ORDER — ACETAMINOPHEN 650 MG RE SUPP: 650.0000 mg | RECTAL | Status: DC | PRN

## 2018-11-20 MED ORDER — MAGNESIUM CITRATE PO SOLN
1.0000 | Freq: Once | ORAL | Status: DC | PRN
  Filled 2018-11-20: qty 296

## 2018-11-20 MED ORDER — OXYCODONE HCL 5 MG PO TABS
5.0000 mg | ORAL_TABLET | ORAL | Status: DC | PRN
  Administered 2018-11-21 (×3): 5 mg via ORAL
  Filled 2018-11-20 (×3): qty 1

## 2018-11-20 MED ORDER — SODIUM CHLORIDE 0.9 % IV SOLN: 250.0000 mL | INTRAVENOUS | Status: DC

## 2018-11-20 MED ORDER — ACETAMINOPHEN 325 MG PO TABS: 650.0000 mg | ORAL_TABLET | Freq: Four times a day (QID) | ORAL | Status: DC | PRN

## 2018-11-20 MED ORDER — PHENOL 1.4 % MT LIQD
1.0000 | OROMUCOSAL | Status: DC | PRN
  Filled 2018-11-20: qty 177

## 2018-11-20 MED ORDER — MENTHOL 3 MG MT LOZG
1.0000 | LOZENGE | OROMUCOSAL | Status: DC | PRN
  Filled 2018-11-20: qty 9

## 2018-11-20 MED ORDER — PROPOFOL 500 MG/50ML IV EMUL
INTRAVENOUS | Status: AC
  Filled 2018-11-20: qty 50

## 2018-11-20 MED ORDER — CEFAZOLIN SODIUM-DEXTROSE 2-4 GM/100ML-% IV SOLN
2.0000 g | Freq: Three times a day (TID) | INTRAVENOUS | Status: DC
  Administered 2018-11-21 (×2): 2 g via INTRAVENOUS
  Filled 2018-11-20 (×3): qty 100

## 2018-11-20 MED ORDER — POLYETHYLENE GLYCOL 3350 17 G PO PACK
17.0000 g | PACK | Freq: Every day | ORAL | Status: DC | PRN
  Filled 2018-11-20: qty 1

## 2018-11-20 MED ORDER — OXYCODONE HCL 5 MG PO TABS
10.0000 mg | ORAL_TABLET | ORAL | Status: DC | PRN
  Administered 2018-11-22 (×2): 10 mg via ORAL
  Filled 2018-11-20 (×2): qty 2

## 2018-11-20 MED ORDER — PROPOFOL 500 MG/50ML IV EMUL
INTRAVENOUS | Status: DC | PRN
  Administered 2018-11-20: 100 ug/kg/min via INTRAVENOUS

## 2018-11-20 MED ORDER — FENTANYL CITRATE (PF) 100 MCG/2ML IJ SOLN
25.0000 ug | INTRAMUSCULAR | Status: DC | PRN
  Administered 2018-11-20 (×3): 50 ug via INTRAVENOUS

## 2018-11-20 MED ORDER — IOHEXOL 350 MG/ML SOLN
75.0000 mL | Freq: Once | INTRAVENOUS | Status: AC | PRN
  Administered 2018-11-20: 75 mL via INTRAVENOUS

## 2018-11-20 MED ORDER — PROPOFOL 500 MG/50ML IV EMUL
INTRAVENOUS | Status: AC
  Filled 2018-11-20: qty 100

## 2018-11-20 MED ORDER — METHOCARBAMOL 500 MG PO TABS
500.0000 mg | ORAL_TABLET | Freq: Four times a day (QID) | ORAL | Status: DC
  Administered 2018-11-20 – 2018-11-22 (×6): 500 mg via ORAL
  Filled 2018-11-20 (×9): qty 1

## 2018-11-20 MED ORDER — ACETAMINOPHEN 10 MG/ML IV SOLN
1000.0000 mg | Freq: Four times a day (QID) | INTRAVENOUS | Status: DC
  Administered 2018-11-20: 1000 mg via INTRAVENOUS

## 2018-11-20 MED ORDER — FENTANYL CITRATE (PF) 100 MCG/2ML IJ SOLN
50.0000 ug | Freq: Once | INTRAMUSCULAR | Status: AC
  Administered 2018-11-20: 50 ug via INTRAVENOUS

## 2018-11-20 MED ORDER — THROMBIN 5000 UNITS EX SOLR
CUTANEOUS | Status: DC | PRN
  Administered 2018-11-20: 5000 [IU] via TOPICAL

## 2018-11-20 MED ORDER — ACETAMINOPHEN 500 MG PO TABS
1000.0000 mg | ORAL_TABLET | Freq: Four times a day (QID) | ORAL | Status: AC
  Administered 2018-11-20 – 2018-11-21 (×3): 1000 mg via ORAL
  Filled 2018-11-20 (×3): qty 2

## 2018-11-20 SURGICAL SUPPLY — 84 items
BAND RUBBER 3X1/6 TAN STRL (MISCELLANEOUS) IMPLANT
BIT DRILL 13 (BIT) ×2 IMPLANT
BIT DRILL 13MM (BIT) ×1
BLADE BOVIE TIP EXT 4 (BLADE) ×3 IMPLANT
BLADE SURG 15 STRL LF DISP TIS (BLADE) ×1 IMPLANT
BLADE SURG 15 STRL SS (BLADE) ×2
BUR DIAMOND COARSE 4.0 RND (BURR) ×3 IMPLANT
BUR NEURO DRILL SOFT 3.0X3.8M (BURR) ×6 IMPLANT
BUR SABER DIAMOND 3.0 (BURR) ×3 IMPLANT
CAGE PEEK 10X14X11 (Cage) ×1 IMPLANT
CAGE PEEK 10X14X11MM (Cage) ×1 IMPLANT
CAGE PEEK 11X14X11 (Cage) ×3 IMPLANT
CANISTER SUCT 1200ML W/VALVE (MISCELLANEOUS) ×6 IMPLANT
CHLORAPREP W/TINT 26 (MISCELLANEOUS) ×6 IMPLANT
COUNTER NEEDLE 20/40 LG (NEEDLE) ×3 IMPLANT
COVER LIGHT HANDLE STERIS (MISCELLANEOUS) ×6 IMPLANT
COVER WAND RF STERILE (DRAPES) ×3 IMPLANT
CRADLE LAMINECT ARM (MISCELLANEOUS) ×3 IMPLANT
CUP MEDICINE 2OZ PLAST GRAD ST (MISCELLANEOUS) ×6 IMPLANT
DERMABOND ADVANCED (GAUZE/BANDAGES/DRESSINGS) ×2
DERMABOND ADVANCED .7 DNX12 (GAUZE/BANDAGES/DRESSINGS) ×1 IMPLANT
DRAIN CHANNEL JP 19F (MISCELLANEOUS) ×3 IMPLANT
DRAPE C-ARM 42X72 X-RAY (DRAPES) ×3 IMPLANT
DRAPE INCISE IOBAN 66X45 STRL (DRAPES) ×3 IMPLANT
DRAPE MICROSCOPE SPINE 48X150 (DRAPES) ×3 IMPLANT
DRAPE POUCH INSTRU U-SHP 10X18 (DRAPES) ×3 IMPLANT
DRAPE SHEET LG 3/4 BI-LAMINATE (DRAPES) ×9 IMPLANT
DRAPE SURG 17X11 SM STRL (DRAPES) ×12 IMPLANT
DRAPE TABLE BACK 80X90 (DRAPES) ×3 IMPLANT
DRAPE THYROID T SHEET (DRAPES) ×3 IMPLANT
ELECT CAUTERY BLADE TIP 2.5 (TIP) ×3
ELECT EZSTD 165MM 6.5IN (MISCELLANEOUS) ×3
ELECTRODE CAUTERY BLDE TIP 2.5 (TIP) ×1 IMPLANT
ELECTRODE EZSTD 165MM 6.5IN (MISCELLANEOUS) ×1 IMPLANT
FEE INTRAOP MONITOR IMPULS NCS (MISCELLANEOUS) ×1 IMPLANT
GAUZE 4X4 16PLY RFD (DISPOSABLE) IMPLANT
GLOVE BIOGEL PI IND STRL 7.0 (GLOVE) ×1 IMPLANT
GLOVE BIOGEL PI INDICATOR 7.0 (GLOVE) ×2
GLOVE INDICATOR 8.0 STRL GRN (GLOVE) ×3 IMPLANT
GLOVE SURG SYN 7.0 (GLOVE) ×6 IMPLANT
GLOVE SURG SYN 8.0 (GLOVE) ×6 IMPLANT
GOWN STRL REUS W/ TWL LRG LVL3 (GOWN DISPOSABLE) ×2 IMPLANT
GOWN STRL REUS W/ TWL XL LVL3 (GOWN DISPOSABLE) ×1 IMPLANT
GOWN STRL REUS W/TWL LRG LVL3 (GOWN DISPOSABLE) ×4
GOWN STRL REUS W/TWL XL LVL3 (GOWN DISPOSABLE) ×2
GRADUATE 1200CC STRL 31836 (MISCELLANEOUS) ×3 IMPLANT
HEMOSTAT SURGICEL 2X3 (HEMOSTASIS) ×3 IMPLANT
INTRAOP MONITOR FEE IMPULS NCS (MISCELLANEOUS) ×1
INTRAOP MONITOR FEE IMPULSE (MISCELLANEOUS) ×2
IV CATH ANGIO 14GX1.88 NO SAFE (IV SOLUTION) ×3 IMPLANT
KIT TURNOVER KIT A (KITS) ×3 IMPLANT
MARKER SKIN DUAL TIP RULER LAB (MISCELLANEOUS) ×3 IMPLANT
NEEDLE HYPO 22GX1.5 SAFETY (NEEDLE) ×3 IMPLANT
NEEDLE SPNL 22GX3.5 QUINCKE BK (NEEDLE) ×3 IMPLANT
NS IRRIG 1000ML POUR BTL (IV SOLUTION) ×3 IMPLANT
PACK LAMINECTOMY NEURO (CUSTOM PROCEDURE TRAY) ×3 IMPLANT
PEEK ANATOMIC STRUT 5X14X11MM (Peek) ×3 IMPLANT
PEEK CAGE 7X14X11 (Cage) ×3 IMPLANT
PIN CASPAR 14 (PIN) ×1 IMPLANT
PIN CASPAR 14MM (PIN) ×3
PLATE 3 60XNS SPNE CVD ANT T (Plate) ×1 IMPLANT
PLATE 3 ATLANTIS TRANS (Plate) ×2 IMPLANT
PUTTY DBM PROPEL SM (Putty) ×3 IMPLANT
SCREW ST 15X4XST VA NS SPNE (Screw) ×6 IMPLANT
SCREW ST VAR 4 ATL (Screw) ×12 IMPLANT
SPACER SPNL 11X14X10XPEEK (Cage) ×1 IMPLANT
SPCR SPNL 11X14X10XPEEK (Cage) ×1 IMPLANT
SPOGE SURGIFLO 8M (HEMOSTASIS) ×4
SPONGE KITTNER 5P (MISCELLANEOUS) ×3 IMPLANT
SPONGE SURGIFLO 8M (HEMOSTASIS) ×2 IMPLANT
SUT MNCRL 4-0 (SUTURE) ×2
SUT MNCRL 4-0 27XMFL (SUTURE) ×1
SUT POLYSORB 3-0 18 V-20 (SUTURE) ×6 IMPLANT
SUT SILK 2 0 (SUTURE)
SUT SILK 2-0 18XBRD TIE 12 (SUTURE) IMPLANT
SUT VICRYL 2-0 SH 8X27 (SUTURE) ×3 IMPLANT
SUT VICRYL 3-0 CR8 SH (SUTURE) ×3 IMPLANT
SUTURE MNCRL 4-0 27XMF (SUTURE) ×1 IMPLANT
SYR 30ML LL (SYRINGE) ×3 IMPLANT
TAPE CLOTH 3X10 WHT NS LF (GAUZE/BANDAGES/DRESSINGS) ×3 IMPLANT
TOWEL OR 17X26 4PK STRL BLUE (TOWEL DISPOSABLE) ×6 IMPLANT
TRAY FOLEY MTR SLVR 16FR STAT (SET/KITS/TRAYS/PACK) ×3 IMPLANT
TUBING CONNECTING 10 (TUBING) ×2 IMPLANT
TUBING CONNECTING 10' (TUBING) ×1

## 2018-11-20 NOTE — Progress Notes (Signed)
[facility] - [place] [facility]   PATIENT NAME: Donald Becker    MR#:  [id number]  DATE OF BIRTH:  November 24, 1980  SUBJECTIVE:  CHIEF COMPLAINT: Patient is having right-sided weakness.  No problems with urination.  Scheduled for decompression spinal fusion today  REVIEW OF SYSTEMS:  CONSTITUTIONAL: No fever, fatigue or weakness.  EYES: No blurred or double vision.  EARS, NOSE, AND THROAT: No tinnitus or ear pain.  RESPIRATORY: No cough, shortness of breath, wheezing or hemoptysis.  CARDIOVASCULAR: No chest pain, orthopnea, edema.  GASTROINTESTINAL: No nausea, vomiting, diarrhea or abdominal pain.  GENITOURINARY: No dysuria, hematuria.  ENDOCRINE: No polyuria, nocturia,  HEMATOLOGY: No anemia, easy bruising or bleeding SKIN: No rash or lesion. MUSCULOSKELETAL: No joint pain or arthritis.   NEUROLOGIC: Right-sided weakness, numbness more significant in the right upper extremity than the lower extremity PSYCHIATRY: No anxiety or depression.   DRUG ALLERGIES:  No Known Allergies  VITALS:  Blood pressure 136/82, pulse 80, temperature 98.3 F (36.8 C), temperature source Oral, resp. rate (!) 25, height 5\' 11"  (1.803 m), weight 115.7 kg, SpO2 96 %.  PHYSICAL EXAMINATION:  GENERAL:  38 y.o.-year-old patient lying in the bed with no acute distress.  EYES: Pupils equal, round, reactive to light and accommodation. No scleral icterus. Extraocular muscles intact.  HEENT: Head atraumatic, normocephalic. Oropharynx and nasopharynx clear.  NECK:  Supple, no jugular venous distention. No thyroid enlargement, no tenderness.  LUNGS: Normal breath sounds bilaterally, no wheezing, rales,rhonchi or crepitation. No use of accessory muscles of respiration.  CARDIOVASCULAR: S1, S2 normal. No murmurs, rubs, or gallops.  ABDOMEN: Soft, nontender, nondistended. Bowel sounds present. No organomegaly or mass.  EXTREMITIES: No pedal edema, cyanosis, or clubbing.  NEUROLOGIC: Awake  alert and oriented x3 sensation intact.  Right upper extremity motor is 4 out of 5, decreased light touch sensation gait not checked.  PSYCHIATRIC: The patient is alert and oriented x 3.  SKIN: No obvious rash, lesion, or ulcer.    LABORATORY PANEL:   CBC Recent Labs  Lab 11/20/18 0150  WBC 12.6*  HGB 15.4  HCT 42.9  PLT 350   ------------------------------------------------------------------------------------------------------------------  Chemistries  Recent Labs  Lab 11/20/18 0150  NA 138  K 3.6  CL 104  CO2 22  GLUCOSE 96  BUN 13  CREATININE 0.87  CALCIUM 8.9  MG 2.3   ------------------------------------------------------------------------------------------------------------------  Cardiac Enzymes No results for input(s): TROPONINI in the last 168 hours. ------------------------------------------------------------------------------------------------------------------  RADIOLOGY:  Ct Angio Neck W And/or Wo Contrast  Result Date: 11/20/2018 CLINICAL DATA:  Jumped into pool and hit head on bottom EXAM: CT ANGIOGRAPHY NECK TECHNIQUE: Multidetector CT imaging of the neck was performed using the standard protocol during bolus administration of intravenous contrast. Multiplanar CT image reconstructions and MIPs were obtained to evaluate the vascular anatomy. Carotid stenosis measurements (when applicable) are obtained utilizing NASCET criteria, using the distal internal carotid diameter as the denominator. CONTRAST:  15mL OMNIPAQUE IOHEXOL 350 MG/ML SOLN COMPARISON:  None. FINDINGS: Skeleton: There is no bony spinal canal stenosis. No lytic or blastic lesion. Other neck: Normal pharynx, larynx and major salivary glands. No cervical lymphadenopathy. Unremarkable thyroid gland. Upper chest: No pneumothorax or pleural effusion. No nodules or masses. Aortic arch: There is no calcific atherosclerosis of the aortic arch. There is no aneurysm, dissection or hemodynamically significant  stenosis of the visualized ascending aorta and aortic arch. Conventional 3 vessel aortic branching pattern. The visualized proximal subclavian arteries are widely patent. Right carotid system: --  Common carotid artery: Widely patent origin without common carotid artery dissection or aneurysm. --Internal carotid artery: No dissection, occlusion or aneurysm. No hemodynamically significant stenosis. --External carotid artery: No acute abnormality. Left carotid system: --Common carotid artery: Widely patent origin without common carotid artery dissection or aneurysm. --Internal carotid artery:No dissection, occlusion or aneurysm. No hemodynamically significant stenosis. --External carotid artery: No acute abnormality. Vertebral arteries: Codominant configuration. Both origins are normal. No dissection, occlusion or flow-limiting stenosis to the vertebrobasilar confluence. Review of the MIP images confirms the above findings IMPRESSION: No acute vascular abnormality of the neck. Electronically Signed   By: Deatra Robinson M.D.   On: 11/20/2018 00:32   Mr Brain Wo Contrast  Result Date: 11/19/2018 CLINICAL DATA:  Head trauma while diving into pool. Bilateral upper and lower extremity weakness EXAM: MRI HEAD WITHOUT CONTRAST MRI CERVICAL SPINE WITHOUT CONTRAST TECHNIQUE: Multiplanar, multiecho pulse sequences of the brain and surrounding structures, and cervical spine, to include the craniocervical junction and cervicothoracic junction, were obtained without intravenous contrast. COMPARISON:  Brain MRI 12/15/2016 FINDINGS: MRI HEAD FINDINGS BRAIN: There is no acute infarct, acute hemorrhage or extra-axial collection. The midline structures are normal. Bioccipital and bilateral basal ganglia areas of encephalomalacia. The white matter signal is normal for the patient's age. The cerebral and cerebellar volume are age-appropriate. No hydrocephalus. Susceptibility-sensitive sequences show no chronic microhemorrhage or  superficial siderosis. No mass lesion. VASCULAR: The major intracranial arterial and venous sinus flow voids are normal. SKULL AND UPPER CERVICAL SPINE: Calvarial bone marrow signal is normal. There is no skull base mass. Visualized upper cervical spine and soft tissues are normal. SINUSES/ORBITS: No fluid levels or advanced mucosal thickening. No mastoid or middle ear effusion. The orbits are normal. MRI CERVICAL SPINE FINDINGS Alignment: Normal Vertebrae: No acute compression fracture, discitis-osteomyelitis, facet edema or other focal marrow lesion. No epidural collection. Cord: There is hyperintense T2-weighted signal within the spinal cord at the C5 level. Posterior Fossa, vertebral arteries, paraspinal tissues: Visualized posterior fossa is normal. Vertebral artery flow voids are preserved. No prevertebral soft tissue swelling. Disc levels: C1-2: Normal. C2-3: Normal. C3-4: Intermediate sized central disc protrusion narrowing the ventral thecal sac and indenting the spinal cord. Moderate spinal canal stenosis. Bilateral uncovertebral hypertrophy, right greater than left with mild bilateral neural foraminal stenosis. C4-5: Large left subarticular disc osteophyte complex flattens the left ventral spinal cord. Mild spinal canal stenosis and severe left neural foraminal stenosis. C5-6: Small left subarticular disc protrusion narrows the spinal canal and indents the spinal cord. Moderate spinal canal stenosis with moderate right and severe left neural foraminal stenosis. C6-7: Intermediate left subarticular disc protrusion narrowing the ventral thecal sac. Moderate spinal canal stenosis. Mild-to-moderate bilateral neural foraminal stenosis. C7-T1: Normal. T1-2: Normal. IMPRESSION: 1. No acute intracranial abnormality. Areas of encephalomalacia within both occipital lobes and the basal ganglia are compatible with the previously demonstrated areas of hypoxic ischemic or toxic metabolic injury. 2. No acute abnormality  of the cervical spine. 3. 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 Hyperintense T2-weighted signal within the spinal cord at the C5 level likely indicates myelomalacia. 4. Multilevel moderate-to-severe neural foraminal stenosis.  The Electronically Signed   By: Deatra Robinson M.D.   On: 11/19/2018 22:56   Mr Cervical Spine Wo Contrast  Result Date: 11/19/2018 CLINICAL DATA:  Head trauma while diving into pool. Bilateral upper and lower extremity weakness EXAM: MRI HEAD WITHOUT CONTRAST MRI CERVICAL SPINE WITHOUT CONTRAST TECHNIQUE: Multiplanar, multiecho pulse sequences of  the brain and surrounding structures, and cervical spine, to include the craniocervical junction and cervicothoracic junction, were obtained without intravenous contrast. COMPARISON:  Brain MRI 12/15/2016 FINDINGS: MRI HEAD FINDINGS BRAIN: There is no acute infarct, acute hemorrhage or extra-axial collection. The midline structures are normal. Bioccipital and bilateral basal ganglia areas of encephalomalacia. The white matter signal is normal for the patient's age. The cerebral and cerebellar volume are age-appropriate. No hydrocephalus. Susceptibility-sensitive sequences show no chronic microhemorrhage or superficial siderosis. No mass lesion. VASCULAR: The major intracranial arterial and venous sinus flow voids are normal. SKULL AND UPPER CERVICAL SPINE: Calvarial bone marrow signal is normal. There is no skull base mass. Visualized upper cervical spine and soft tissues are normal. SINUSES/ORBITS: No fluid levels or advanced mucosal thickening. No mastoid or middle ear effusion. The orbits are normal. MRI CERVICAL SPINE FINDINGS Alignment: Normal Vertebrae: No acute compression fracture, discitis-osteomyelitis, facet edema or other focal marrow lesion. No epidural collection. Cord: There is hyperintense T2-weighted signal within the spinal cord at the C5 level. Posterior Fossa, vertebral  arteries, paraspinal tissues: Visualized posterior fossa is normal. Vertebral artery flow voids are preserved. No prevertebral soft tissue swelling. Disc levels: C1-2: Normal. C2-3: Normal. C3-4: Intermediate sized central disc protrusion narrowing the ventral thecal sac and indenting the spinal cord. Moderate spinal canal stenosis. Bilateral uncovertebral hypertrophy, right greater than left with mild bilateral neural foraminal stenosis. C4-5: Large left subarticular disc osteophyte complex flattens the left ventral spinal cord. Mild spinal canal stenosis and severe left neural foraminal stenosis. C5-6: Small left subarticular disc protrusion narrows the spinal canal and indents the spinal cord. Moderate spinal canal stenosis with moderate right and severe left neural foraminal stenosis. C6-7: Intermediate left subarticular disc protrusion narrowing the ventral thecal sac. Moderate spinal canal stenosis. Mild-to-moderate bilateral neural foraminal stenosis. C7-T1: Normal. T1-2: Normal. IMPRESSION: 1. No acute intracranial abnormality. Areas of encephalomalacia within both occipital lobes and the basal ganglia are compatible with the previously demonstrated areas of hypoxic ischemic or toxic metabolic injury. 2. No acute abnormality of the cervical spine. 3. 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 Hyperintense T2-weighted signal within the spinal cord at the C5 level likely indicates myelomalacia. 4. Multilevel moderate-to-severe neural foraminal stenosis.  The Electronically Signed   By: Deatra RobinsonKevin  Herman M.D.   On: 11/19/2018 22:56    EKG:   Orders placed or performed during the hospital encounter of 12/12/16  . EKG 12-Lead  . EKG 12-Lead  . EKG 12-Lead  . EKG 12-Lead    ASSESSMENT AND PLAN:   #Cervical stenosis with spinal cord injury Pain management as needed Seen by neurosurgery recommending decompression and fusion.  Patient is agreeable.  Appreciate  neurosurgery recommendations   #History of CVA in the past Not on aspirin or statin Check fasting lipid panel  #Leukocytosis probably reactive  All the records are reviewed and case discussed with Care Management/Social Workerr. Management plans discussed with the patient, he is in agreement.  CODE STATUS: fc   TOTAL TIME TAKING CARE OF THIS PATIENT: 36 minutes.   POSSIBLE D/C IN 2  DAYS, DEPENDING ON CLINICAL CONDITION.  Note: This dictation was prepared with Dragon dictation along with smaller phrase technology. Any transcriptional errors that result from this process are unintentional.   Ramonita LabAruna El Pile M.D on 11/20/2018 at 11:43 AM  Between 7am to 6pm - Pager - [phone] After 6pm go to [email or web address] - Scientist, research (life [email or web address])password [email or web address]  [facility]  Office  [phone]  CC: Primary care physician; Patient, No Pcp Per

## 2018-11-20 NOTE — Anesthesia Preprocedure Evaluation (Addendum)
Anesthesia Evaluation  Patient identified by MRN, date of birth, ID band Patient awake    Reviewed: Allergy & Precautions, H&P , NPO status , Patient's Chart, lab work & pertinent test results  History of Anesthesia Complications Negative for: history of anesthetic complications  Airway Mallampati: III  TM Distance: >3 FB Neck ROM: limited    Dental  (+) Chipped, Poor Dentition   Pulmonary neg shortness of breath, Current Smoker,  Signs and symptoms suggestive of sleep apnea            Cardiovascular Exercise Tolerance: Good (-) angina(-) Past MI negative cardio ROS       Neuro/Psych CVA negative psych ROS   GI/Hepatic negative GI ROS, Neg liver ROS, neg GERD  ,  Endo/Other  negative endocrine ROS  Renal/GU Renal disease  negative genitourinary   Musculoskeletal   Abdominal   Peds negative pediatric ROS (+)  Hematology negative hematology ROS (+)   Anesthesia Other Findings Past Medical History: No date: CVA (cerebral vascular accident) (HCC)  Past Surgical History: No date: HERNIA REPAIR  BMI    Body Mass Index:  35.57 kg/m      Reproductive/Obstetrics negative OB ROS                            Anesthesia Physical Anesthesia Plan  ASA: III  Anesthesia Plan: General and General ETT   Post-op Pain Management:    Induction: Intravenous  PONV Risk Score and Plan: Ondansetron, Dexamethasone, Midazolam and Treatment may vary due to age or medical condition  Airway Management Planned: Oral ETT and Video Laryngoscope Planned  Additional Equipment:   Intra-op Plan:   Post-operative Plan: Extubation in OR  Informed Consent: I have reviewed the patients History and Physical, chart, labs and discussed the procedure including the risks, benefits and alternatives for the proposed anesthesia with the patient or authorized representative who has indicated his/her understanding  and acceptance.     Dental advisory given  Plan Discussed with: CRNA and Surgeon  Anesthesia Plan Comments: (Patient consented for risks of anesthesia including but not limited to:  - adverse reactions to medications - damage to teeth, lips or other oral mucosa - sore throat or hoarseness - Damage to heart, brain, lungs or loss of life  Patient voiced understanding.)       Anesthesia Quick Evaluation

## 2018-11-20 NOTE — Anesthesia Post-op Follow-up Note (Signed)
Anesthesia QCDR form completed.        
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
## 2018-11-20 NOTE — Anesthesia Procedure Notes (Signed)
Arterial Line Insertion Start/End4/22/2020 12:45 PM, 11/20/2018 12:50 PM Performed by: Alver Fisher, MD, anesthesiologist  Preanesthetic checklist: patient identified, IV checked, site marked, risks and benefits discussed, surgical consent, monitors and equipment checked, pre-op evaluation, timeout performed and anesthesia consent Right, radial was placed Catheter size: 20 G Hand hygiene performed  and Seldinger technique used Allen's test indicative of satisfactory collateral circulation Attempts: 1 Procedure performed without using ultrasound guided technique. Following insertion, Biopatch and dressing applied. Post procedure assessment: normal  Patient tolerated the procedure well with no immediate complications.

## 2018-11-20 NOTE — Transfer of Care (Signed)
Immediate Anesthesia Transfer of Care Note  Patient: Maximiano Coss  Procedure(s) Performed: ANTERIOR CERVICAL DECOMPRESSION/DISCECTOMY FUSION 3 LEVELS (N/A Neck) ANTERIOR CERVICAL CORPECTOMY C4 (N/A Neck)  Patient Location: PACU  Anesthesia Type:General  Level of Consciousness: awake, alert  and oriented  Airway & Oxygen Therapy: Patient Spontanous Breathing and Patient connected to face mask oxygen  Post-op Assessment: Report given to RN and Post -op Vital signs reviewed and stable  Post vital signs: Reviewed and stable  Last Vitals:  Vitals Value Taken Time  BP 146/90 11/20/2018  6:10 PM  Temp 36.6 C 11/20/2018  6:10 PM  Pulse 96 11/20/2018  6:19 PM  Resp 33 11/20/2018  6:19 PM  SpO2 96 % 11/20/2018  6:19 PM  Vitals shown include unvalidated device data.  Last Pain:  Vitals:   11/20/18 1810  TempSrc:   PainSc: 0-No pain         Complications: No apparent anesthesia complications
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
## 2018-11-21 ENCOUNTER — Encounter: Payer: Self-pay | Admitting: Neurosurgery

## 2018-11-21 ENCOUNTER — Inpatient Hospital Stay: Payer: Medicaid Other

## 2018-11-21 LAB — BASIC METABOLIC PANEL
Anion gap: 7 (ref 5–15)
BUN: 9 mg/dL (ref 6–20)
CO2: 22 mmol/L (ref 22–32)
Calcium: 8.2 mg/dL — ABNORMAL LOW (ref 8.9–10.3)
Chloride: 109 mmol/L (ref 98–111)
Creatinine, Ser: 0.77 mg/dL (ref 0.61–1.24)
GFR calc Af Amer: >60 mL/min (ref >60)
GFR calc non Af Amer: >60 mL/min (ref >60)
Glucose, Bld: 137 mg/dL — ABNORMAL HIGH (ref 70–99)
Potassium: 3.9 mmol/L (ref 3.5–5.1)
Sodium: 138 mmol/L (ref 135–145)

## 2018-11-21 LAB — LIPID PANEL
Cholesterol: 152 mg/dL (ref 0–200)
HDL: 31 mg/dL — ABNORMAL LOW (ref >40)
LDL Cholesterol: 92 mg/dL (ref 0–99)
Total CHOL/HDL Ratio: 4.9 RATIO
Triglycerides: 143 mg/dL (ref <150)
VLDL: 29 mg/dL (ref 0–40)

## 2018-11-21 LAB — PREPARE PLATELET PHERESIS: Unit division: 0

## 2018-11-21 LAB — CBC
HCT: 41.3 % (ref 39.0–52.0)
Hemoglobin: 13.8 g/dL (ref 13.0–17.0)
MCH: 31.7 pg (ref 26.0–34.0)
MCHC: 33.4 g/dL (ref 30.0–36.0)
MCV: 94.7 fL (ref 80.0–100.0)
Platelets: 296 K/uL (ref 150–400)
RBC: 4.36 MIL/uL (ref 4.22–5.81)
RDW: 12.5 % (ref 11.5–15.5)
WBC: 14 K/uL — ABNORMAL HIGH (ref 4.0–10.5)
nRBC: 0 % (ref 0.0–0.2)

## 2018-11-21 LAB — HIV ANTIBODY (ROUTINE TESTING W REFLEX): HIV Screen 4th Generation wRfx: NONREACTIVE

## 2018-11-21 LAB — BPAM PLATELET PHERESIS
Blood Product Expiration Date: 202004232359
Unit Type and Rh: 6200

## 2018-11-21 MED ORDER — NICOTINE 14 MG/24HR TD PT24
14.0000 mg | MEDICATED_PATCH | Freq: Every day | TRANSDERMAL | Status: DC
  Administered 2018-11-21 – 2018-11-22 (×2): 14 mg via TRANSDERMAL
  Filled 2018-11-21 (×2): qty 1

## 2018-11-21 NOTE — Progress Notes (Signed)
Procedure: C3-4 and C4-5 ACDF with C4 corpectomy, C5-6 ACDF Procedure date: 11/21/2018 Diagnosis: Cervical myelopathy, spinal cord injury  History: Donald Becker is s/p 3 4 C4-5 ACDF with C4 corpectomy and C5-6 ACDF for cervical myelopathy/acute spinal cord injury. POD1: He is recovering well.  Able to tolerate soft food and swallow without issues.  Requesting progression to more solid food diet.  Feels right lower extremity symptoms have resolved.  Complains of tingling in the right arm, however, prior to surgery he states that the arm was numb.  Feels strength is improving in right upper extremity.  Denies cervical or shoulder pain at this time. Continues to wear [place] collar. Maintaining MAP at recommended levels without pressors. Drain output minimal overnight.  Physical Exam: Vitals:   11/21/18 0700 11/21/18 0800  BP: 134/70 130/85  Pulse: 70 81  Resp: 17 19  Temp:  98 F (36.7 C)  SpO2: 96% 93%    AA Ox3 Strength: 5/5 throughout lower extremities and left upper extremity.  4/5 right grip, bicep, deltoid, wrist extension./5 right tricep Sensation: Tingling sensation elicited throughout right upper extremity to light touch.  Otherwise sensation intact in left upper extremity, intact and symmetric throughout lower extremities Skin: Intact at incision site.  Noted mild, dark blood leakage from drain site insertion point.  No edema, erythema, purulent drainage, bright red blood leakage.  Data:  Recent Labs  Lab 11/19/18 2124 11/20/18 0150 11/21/18 0548  NA 137 138 138  K 3.9 3.6 3.9  CL 105 104 109  CO2 19* 22 22  BUN 11 13 9   CREATININE 0.94 0.87 0.77  GLUCOSE 96 96 137*  CALCIUM 9.1 8.9 8.2*   No results for input(s): AST, ALT, ALKPHOS in the last 168 hours.  Invalid input(s): TBILI   Recent Labs  Lab 11/19/18 2124 11/20/18 0150 11/21/18 0548  WBC 11.2* 12.6* 14.0*  HGB 15.7 15.4 13.8  HCT 45.0 42.9 41.3  PLT 337 350 296   No results for input(s): APTT,  INR in the last 168 hours.       Other tests/results:  EXAM: CERVICAL SPINE - 2-3 VIEW 11/21/2018  COMPARISON:  MRI cervical spine 11/19/2018.  FINDINGS: The patient has undergone C3-6 cervical C3-6 cervical fusion since the prior exam. Hardware is in intact and appropriately positioned. No acute abnormality.  IMPRESSION: Status post C3-6 fusion.  No acute finding.  Assessment/Plan: Donald Becker is POD1 status post C3-4 and C4-5 ACDF with C4 corpectomy and C5-6 ACDF for cervical myelopathy with spinal cord injury.  Symptoms that were present prior to surgery are improving and he is recovering well.  Will continue to monitor MAP.  [employer] contacted for aspen brace - they should be here some time this morning.  Drain removed without issue. Ancef d/c with drain removal.    - mobilize - pain control - DVT prophylaxis - PTOT  Ivar Drape PA-C Department of Neurosurgery

## 2018-11-21 NOTE — Anesthesia Postprocedure Evaluation (Signed)
Anesthesia Post Note  Patient: Conservation officer, historic buildings  Procedure(s) Performed: ANTERIOR CERVICAL DECOMPRESSION/DISCECTOMY FUSION 3 LEVELS (N/A Neck) ANTERIOR CERVICAL CORPECTOMY C4 (N/A Neck)  Patient location during evaluation: ICU Anesthesia Type: General Level of consciousness: awake, awake and alert and oriented Pain management: pain level controlled Vital Signs Assessment: post-procedure vital signs reviewed and stable Respiratory status: spontaneous breathing Cardiovascular status: blood pressure returned to baseline Postop Assessment: no headache, no backache and no apparent nausea or vomiting Anesthetic complications: no     Last Vitals:  Vitals:   11/21/18 1300 11/21/18 1400  BP: 131/70 125/82  Pulse: 82 82  Resp: (!) 24 (!) 22  Temp:    SpO2: 91% 92%    Last Pain:  Vitals:   11/21/18 1200  TempSrc:   PainSc: 0-No pain                 Donald Becker Lawerance Cruel

## 2018-11-21 NOTE — Evaluation (Signed)
Occupational Therapy Evaluation Patient Details Name: Donald Becker MRN: [id number] DOB: 02/07/1981 Today's Date: 11/21/2018    History of Present Illness 38 y/o male here after trying to do flip into pool and striking head in shallow water.  Pt initially with U&LE weakness (R>L), ultimately pt needed C3-5 ACDF and C5-6 discectomy.   Clinical Impression   Pt seen for OT evaluation and treatment this date. Pt eager to work with therapist. Pt lives with his spouse and children in a 2nd floor home (1st floor is former garage/unfinished and not livable per wife), with a flight of stairs to enter home. 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 Pt eager to return to PLOF. Currently, pt presents with significant RUE weakness, impaired sensation, and coordination deficits limiting his ability to perform functional mobility and all ADL and IADL tasks at baseline independence using his R dominant UE. Pt requires additional time/effor to perform sup>sit EOB. Significant R shoulder pain at start of session which was alleviated with increased stabilization of the shoulder which pt was able to maintain t/o session and reported significant improvement in pain during and at end of session. Pt required stabilization at the elbow, built up handle, and occasional CGA to Min A for self feeding movements. 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 Handout provided to support recall. Per pt request, spoke with spouse on the phone with pt for update on pt's progress during therapy session. Pt and spouse very appreciative. Spouse provided additional home/environmental set up information to help better inform therapy recommendations. Pt will benefit from continued skilled OT Services to maximize return to PLOF including return to work, minimize risk of falls and functional decline, and minimize caregiver burden  in the long term. Recommend outpatient OT services - particularly with hand/UE OT. Physician Assistant notified of recommendations.     Follow Up Recommendations  Outpatient OT    Equipment Recommendations  3 in 1 bedside commode    Recommendations for Other Services       Precautions / Restrictions Precautions Precautions: Fall;Cervical Precaution Booklet Issued: Yes (comment) Required Braces or Orthoses: Cervical Brace Cervical Brace: Hard collar;At all times Restrictions Weight Bearing Restrictions: No      Mobility Bed Mobility Overal bed mobility: Modified Independent             General bed mobility comments: increased time/effort, but able to do without assist to R side of bed  Transfers Overall transfer level: Needs assistance Equipment used: None Transfers: Sit to/from Stand;Lateral/Scoot Transfers Sit to Stand: Min guard        Lateral/Scoot Transfers: Min guard General transfer comment: mildly unsteady when his attention was divided and laterally stepping to his R side    Balance Overall balance assessment: Mild deficits observed, not formally tested                                         ADL either performed or assessed with clinical judgement   ADL Overall ADL's : Needs assistance/impaired Eating/Feeding: Sitting;Minimal assistance;Set up;With adaptive utensils Eating/Feeding Details (indicate cue type and reason): using tray table for elbow stability and built up handle, pt able to use R (dom) hand to grossly grasp handle and bring to mouth. fatigues quickly, decreased quality of movement Grooming: Sitting;Minimal assistance;Set up  Upper Body Bathing: Sitting;Moderate assistance   Lower Body Bathing: Sit to/from stand;Moderate assistance   Upper Body Dressing : Sitting;Moderate assistance   Lower Body Dressing: Sit to/from stand;Moderate assistance   Toilet Transfer: BSC;Min guard;Ambulation;Cueing for safety                    Vision Patient Visual Report: No change from baseline       Perception     Praxis      Pertinent Vitals/Pain Pain Assessment: 0-10 Pain Score: 9  Pain Location: R shoulder at start of session, 0/10 after shoulder stabilization Pain Descriptors / Indicators: Sharp Pain Intervention(s): Limited activity within patient's tolerance;Monitored during session;Repositioned;Relaxation     Hand Dominance Right   Extremity/Trunk Assessment Upper Extremity Assessment Upper Extremity Assessment: RUE deficits/detail(LUE grossly WFL) RUE Deficits / Details: grossly at least 2+/5 in shoulder/elbow/wrist/fingers, impaired sensation ("pins and needles") throughout RUE, significant shoulder pain/muscle tightness initially, decreased FMC and GMC RUE Sensation: decreased proprioception;decreased light touch RUE Coordination: decreased fine motor;decreased gross motor   Lower Extremity Assessment Lower Extremity Assessment: Defer to PT evaluation;Generalized weakness   Cervical / Trunk Assessment Cervical / Trunk Assessment: Other exceptions Cervical / Trunk Exceptions: cervical collar    Communication Communication Communication: No difficulties   Cognition Arousal/Alertness: Awake/alert Behavior During Therapy: WFL for tasks assessed/performed Overall Cognitive Status: Within Functional Limits for tasks assessed                                 General Comments: hx of STM issues since CVA 2ya   General Comments       Exercises Other Exercises Other Exercises: pt instructed in [facility] and ther-ex for RUE, pt instructed in joint protection strategies, adaptive equipment for self feeding, and cervical spinal precautions - will benefit from additional instruction to support recall and carryover given STM deficits   Shoulder Instructions      Home Living Family/patient expects to be discharged to:: Private residence Living Arrangements: Spouse/significant  other;Children Available Help at Discharge: Family Type of Home: House Home Access: Stairs to enter Secretary/administrator of Steps: 15 Entrance Stairs-Rails: Left(wall on R side) Home Layout: Two level;Bed/bath upstairs Alternate Level Stairs-Number of Steps: 1st floor is former garage and not liveable. 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, very active, prefers not to drive so spouse does all driving and manages finances for home/business        OT Problem List: Decreased strength;Decreased coordination;Decreased knowledge of use of DME or AE;Decreased knowledge of precautions;Decreased safety awareness;Impaired balance (sitting and/or standing);Impaired sensation;Pain;Impaired UE functional use      OT Treatment/Interventions: Self-care/ADL training;Balance training;Therapeutic exercise;Neuromuscular education;Therapeutic activities;DME and/or AE instruction;Patient/family education;Cognitive remediation/compensation    OT Goals(Current goals can be found in the care plan section) Acute Rehab OT Goals Patient Stated Goal: go home ASAP and continue therapy to return to PLOF OT Goal Formulation: With patient Time For Goal Achievement: 12/05/18 Potential to Achieve Goals: Good ADL Goals Pt Will Perform Eating: with adaptive utensils;sitting;with set-up;with supervision Pt Will Perform Lower Body Dressing: with mod assist;sit to/from stand;with adaptive equipment Pt Will Transfer to Toilet: with supervision;ambulating Additional ADL Goal #1:  Pt will demonstrate understanding of cervical spinal precautions while performing ADL tasks, requiring supervision and occasional verbal cues to maintain precautions.  OT Frequency: Min  2X/week   Barriers to D/C:            Co-evaluation              AM-PAC OT "6 Clicks" Daily Activity     Outcome Measure Help from another person eating meals?: A Lot Help from another person taking care of personal grooming?: A Lot Help from another person toileting, which includes using toliet, bedpan, or urinal?: A Lot Help from another person bathing (including washing, rinsing, drying)?: A Lot Help from another person to put on and taking off regular upper body clothing?: A Lot Help from another person to put on and taking off regular lower body clothing?: A Lot 6 Click Score: 12   End of Session Nurse Communication: Other (comment)(pt requesting nicotine patch)  Activity Tolerance: Patient tolerated treatment well Patient left: in bed;with call bell/phone within reach;with bed alarm set  OT Visit Diagnosis: Other abnormalities of gait and mobility (R26.89);Pain;Other symptoms and signs involving the nervous system (R29.898) Pain - Right/Left: Right Pain - part of body: Shoulder                Time: 4098-1191 OT Time Calculation (min): 75 min Charges:  OT General Charges $OT Visit: 1 Visit OT Evaluation $OT Eval High Complexity: 1 High OT Treatments $Self Care/Home Management : 23-37 mins $Neuromuscular Re-education: 38-52 mins  Richrd Prime, MPH, MS, OTR/L ascom [phone] 11/21/18, 4:33 PM

## 2018-11-21 NOTE — Evaluation (Signed)
Physical Therapy Evaluation Patient Details Name: Donald Becker MRN: [id number] DOB: Mar 11, 1981 Today's Date: 11/21/2018   History of Present Illness  38 y/o male here after trying to do flip into pool and striking head in shallow water.  Pt initially with U&LE weakness (R>L), ultimately pt needed C3-5 ACDF and C5-6 discectomy.  Clinical Impression  Pt did relatively well with PT exam, he had a few episodes of small stagger stepping that he easily self-arrested during gait training apart from exam however his R UE limitations did appear to throw him off enough to force him to slow and be more deliberate with activity.  He ultimately was able to walk >400 ft and showed no overt safety issues that would keep him from being able to return home with family.  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  Pt's biggest issue at this point is R UE weakness, coordination, sensation issues that are significant as he has very little functional use of R hand/arm/shoulder.      Follow Up Recommendations Follow surgeon's recommendation for DC plan and follow-up therapies(HHPT vs outpt)    Equipment Recommendations  None recommended by PT    Recommendations for Other Services       Precautions / Restrictions Precautions Precautions: Fall Required Braces or Orthoses: Cervical Brace Restrictions Weight Bearing Restrictions: No      Mobility  Bed Mobility Overal bed mobility: Independent             General bed mobility comments: Pt initially reaching for help from PT with L UE, however with cuing to try on his own he was able to attain sitting w/o physical assist  Transfers Overall transfer level: Modified independent Equipment used: None             General transfer comment: Pt able to rise and maintain standing w/o assist, good  confidence  Ambulation/Gait Ambulation/Gait assistance: Min guard Gait Distance (Feet): 400 Feet Assistive device: None       General Gait Details: Pt was able to ambulate around the unit with relatively consistent cadence.  Pt with a few small scale stagger steps that he easily self arrested (associated with being unable to move neck, in brace, to see?)  He did not have significant fatigue or other issues, however lack of control in R UE did appear to alter continuity of cadence  Stairs            Wheelchair Mobility    Modified Rankin (Stroke Patients Only)       Balance Overall balance assessment: Modified Independent(able to maintain eyes closed with perterbations , heel raise)                                           Pertinent Vitals/Pain Pain Assessment: 0-10 Pain Score: 6  Pain Location: R shoulder, almost no neck pain    Home Living Family/patient expects to be discharged to:: Private residence Living Arrangements: Spouse/significant other;Children Available Help at Discharge: Family Type of Home: House Home Access: Stairs to enter   Secretary/administrator of Steps: 1 Home Layout: One level Home Equipment: None      Prior Function Level of Independence: Independent  Comments: Pt runs construction business, very active     Hand Dominance        Extremity/Trunk Assessment   Upper Extremity Assessment Upper Extremity Assessment: LUE deficits/detail(good strength/ROM on L, mild sensory impairment) LUE Deficits / Details: very limited functional ROM, grossly 2/5 t/o with significant sensory defecits.  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      General Comments      Exercises     Assessment/Plan    PT Assessment Patient needs continued PT services  PT Problem List Decreased strength;Decreased range of motion;Decreased activity tolerance;Decreased balance;Decreased mobility;Decreased coordination;Decreased knowledge of use of DME;Decreased safety awareness;Impaired sensation;Pain       PT Treatment Interventions DME instruction;Gait training;Functional mobility training;Stair training;Therapeutic activities;Therapeutic exercise;Balance training;Neuromuscular re-education;Patient/family education    PT Goals (Current goals can be found in the Care Plan section)  Acute Rehab PT Goals Patient Stated Goal: go home ASAP PT Goal Formulation: With patient Time For Goal Achievement: 12/05/18 Potential to Achieve Goals: Good    Frequency 7X/week   Barriers to discharge        Co-evaluation               AM-PAC PT "6 Clicks" Mobility  Outcome Measure Help needed turning from your back to your side while in a flat bed without using bedrails?: None Help needed moving from lying on your back to sitting on the side of a flat bed without using bedrails?: None Help needed moving to and from a bed to a chair (including a wheelchair)?: None Help needed standing up from a chair using your arms (e.g., wheelchair or bedside chair)?: None Help needed to walk in hospital room?: None Help needed climbing 3-5 steps with a railing? : A Little 6 Click Score: 23    End of Session Equipment Utilized During Treatment: Cervical collar;Gait belt Activity Tolerance: Patient tolerated treatment well Patient left: with call bell/phone within reach;in chair Nurse Communication: Mobility status PT Visit Diagnosis: Muscle weakness (generalized)  (M62.81);Other symptoms and signs involving the nervous system (R29.898);Pain;Difficulty in walking, not elsewhere classified (R26.2) Pain - Right/Left: Right Pain - part of body: Shoulder    Time: [id number] PT Time Calculation (min) (ACUTE ONLY): 27 min   Charges:   PT Evaluation $PT Eval Low Complexity: 1 Low PT Treatments $Gait Training: 8-22 mins        Donald Becker, DPT 11/21/2018, 1:31 PM

## 2018-11-21 NOTE — Plan of Care (Signed)
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
## 2018-11-22 LAB — CBC WITH DIFFERENTIAL/PLATELET
Abs Immature Granulocytes: 0.05 10*3/uL (ref 0.00–0.07)
Basophils Absolute: 0.1 10*3/uL (ref 0.0–0.1)
Basophils Relative: 1 %
Eosinophils Absolute: 0.1 10*3/uL (ref 0.0–0.5)
Eosinophils Relative: 1 %
HCT: 40.9 % (ref 39.0–52.0)
Hemoglobin: 13.6 g/dL (ref 13.0–17.0)
Immature Granulocytes: 0 %
Lymphocytes Relative: 19 %
Lymphs Abs: 2.7 10*3/uL (ref 0.7–4.0)
MCH: 31.9 pg (ref 26.0–34.0)
MCHC: 33.3 g/dL (ref 30.0–36.0)
MCV: 95.8 fL (ref 80.0–100.0)
Monocytes Absolute: 1.5 10*3/uL — ABNORMAL HIGH (ref 0.1–1.0)
Monocytes Relative: 11 %
Neutro Abs: 9.7 10*3/uL — ABNORMAL HIGH (ref 1.7–7.7)
Neutrophils Relative %: 68 %
Platelets: 261 10*3/uL (ref 150–400)
RBC: 4.27 MIL/uL (ref 4.22–5.81)
RDW: 12.9 % (ref 11.5–15.5)
WBC: 14.1 10*3/uL — ABNORMAL HIGH (ref 4.0–10.5)
nRBC: 0 % (ref 0.0–0.2)

## 2018-11-22 LAB — BASIC METABOLIC PANEL
Anion gap: 7 (ref 5–15)
BUN: 11 mg/dL (ref 6–20)
CO2: 24 mmol/L (ref 22–32)
Calcium: 8.2 mg/dL — ABNORMAL LOW (ref 8.9–10.3)
Chloride: 106 mmol/L (ref 98–111)
Creatinine, Ser: 0.74 mg/dL (ref 0.61–1.24)
GFR calc Af Amer: >60 mL/min (ref >60)
GFR calc non Af Amer: >60 mL/min (ref >60)
Glucose, Bld: 116 mg/dL — ABNORMAL HIGH (ref 70–99)
Potassium: 3.7 mmol/L (ref 3.5–5.1)
Sodium: 137 mmol/L (ref 135–145)

## 2018-11-22 MED ORDER — BENZONATATE 100 MG PO CAPS
100.0000 mg | ORAL_CAPSULE | Freq: Three times a day (TID) | ORAL | Status: DC | PRN
  Administered 2018-11-22: 100 mg via ORAL
  Filled 2018-11-22: qty 1

## 2018-11-22 MED ORDER — POLYETHYLENE GLYCOL 3350 17 G PO PACK
17.0000 g | PACK | Freq: Every day | ORAL | Status: DC
  Administered 2018-11-22: 17 g via ORAL

## 2018-11-22 MED ORDER — SENNOSIDES-DOCUSATE SODIUM 8.6-50 MG PO TABS
2.0000 | ORAL_TABLET | Freq: Two times a day (BID) | ORAL | Status: DC
  Administered 2018-11-22: 2 via ORAL
  Filled 2018-11-22: qty 2

## 2018-11-22 MED ORDER — OXYCODONE HCL 5 MG PO TABS: 5.0000 mg | ORAL_TABLET | ORAL | 0 refills | Status: AC | PRN

## 2018-11-22 NOTE — Discharge Summary (Signed)
Procedure: C3-4 and C4-5 ACDF with C4 corpectomy, C5-6 ACDF Procedure date: 11/21/2018 Diagnosis: Cervical myelopathy, spinal cord injury  History: Donald Becker is s/p 3 4 C4-5 ACDF with C4 corpectomy and C5-6 ACDF for cervical myelopathy/acute spinal cord injury.  POD2: Recovering well. Feel strength is improving in right upper extremity. Sensation is improving as well. Back to baseline right lower extremity. Overall pain rating 5-6/10 - tingling in right arm. Denies neck pain. New collar fits well and is helping. Received PT and progressed well. Ambulating without issue and able tolerate stairs. No issues tolerating food. Voiding without issue.   POD1: He is recovering well.  Able to tolerate soft food and swallow without issues.  Requesting progression to more solid food diet.  Feels right lower extremity symptoms have resolved.  Complains of tingling in the right arm, however, prior to surgery he states that the arm was numb.  Feels strength is improving in right upper extremity.  Denies cervical or shoulder pain at this time. Continues to wear [place] collar. Maintaining MAP at recommended levels without pressors. Drain output minimal overnight.  Physical Exam: Vitals:   11/22/18 0800 11/22/18 0900  BP: (!) 121/99 118/75  Pulse: 87 92  Resp:    Temp:    SpO2: 91% 93%    AA Ox3 Strength: 5/5 throughout lower extremities and left upper extremity.  4/5 right grip, bicep, tricep, deltoid, wrist extension. Sensation: Tingling sensation elicited throughout right upper extremity to light touch.  Otherwise sensation intact in left upper extremity, intact and symmetric throughout lower extremities Skin: Glue Intact at incision site.   No edema, erythema, purulent drainage, bright red blood leakage.  Data:  Recent Labs  Lab 11/20/18 0150 11/21/18 0548 11/22/18 0458  NA 138 138 137  K 3.6 3.9 3.7  CL 104 109 106  CO2 22 22 24   BUN 13 9 11   CREATININE 0.87 0.77 0.74  GLUCOSE 96  137* 116*  CALCIUM 8.9 8.2* 8.2*   No results for input(s): AST, ALT, ALKPHOS in the last 168 hours.  Invalid input(s): TBILI   Recent Labs  Lab 11/20/18 0150 11/21/18 0548 11/22/18 0458  WBC 12.6* 14.0* 14.1*  HGB 15.4 13.8 13.6  HCT 42.9 41.3 40.9  PLT 350 296 261   No results for input(s): APTT, INR in the last 168 hours.       Other tests/results:  EXAM: CERVICAL SPINE - 2-3 VIEW 11/21/2018  COMPARISON:  MRI cervical spine 11/19/2018.  FINDINGS: The patient has undergone C3-6 cervical C3-6 cervical fusion since the prior exam. Hardware is in intact and appropriately positioned. No acute abnormality.  IMPRESSION: Status post C3-6 fusion.  No acute finding.  Assessment/Plan: Donald Becker is POD2 status post C3-4 and C4-5 ACDF with C4 corpectomy and C5-6 ACDF for cervical myelopathy with spinal cord injury.  Symptoms that were present prior to surgery are improving and he is recovering well.  MAP remained at recommended levels, no pressors required. Received aspen brace. OT and PT evaluation complete. Recommendations OT hand specialist for right upper extremity weakness.    Ivar Drape PA-C Department of Neurosurgery

## 2018-11-22 NOTE — TOC Transition Note (Signed)
Transition of Care [facility]) - CM/SW Discharge Note   Patient Details  Name: Donald Becker MRN: [id number] Date of Birth: 1981/01/06  [facility]) CM/SW Contact:  Allayne Butcher, RN Phone Number: 11/22/2018, 12:23 PM   Clinical Narrative:       Final next level of care: OP Rehab Barriers to Discharge: No Barriers Identified   Patient Goals and CMS Choice   CMS Medicare.gov Compare Post Acute Care list provided to:: Patient Choice offered to / list presented to : Patient  Discharge Placement                       Discharge Plan and Services   Discharge Planning Services: CM Consult Post Acute Care Choice: Durable Medical Equipment          DME Arranged: 3-N-1 DME Agency: [employer] Date DME Agency Contacted: 11/22/18 Time DME Agency Contacted: 1200 Representative spoke with at DME Agency: Mitchell Heir HH Arranged: PT, OT HH Agency: [facility] ([facility])        Social Determinants of Health (SDOH) Interventions     Readmission Risk Interventions No flowsheet data found.

## 2018-11-22 NOTE — Discharge Instructions (Signed)

## 2018-11-22 NOTE — Progress Notes (Signed)
[facility] [place] at [facility]   PATIENT NAME: Donald Becker    MR#:  [id number]  DATE OF BIRTH:  11/04/1980  SUBJECTIVE:  CHIEF COMPLAINT: Patient is reporting improvement in right hand weakness  Ambulated with physical therapy in the hallway without any difficulty.  Patient is getting discharged from neurosurgery standpoint  REVIEW OF SYSTEMS:  CONSTITUTIONAL: No fever, fatigue or weakness.  EYES: No blurred or double vision.  EARS, NOSE, AND THROAT: No tinnitus or ear pain.  RESPIRATORY: No cough, shortness of breath, wheezing or hemoptysis.  CARDIOVASCULAR: No chest pain, orthopnea, edema.  GASTROINTESTINAL: No nausea, vomiting, diarrhea or abdominal pain.  GENITOURINARY: No dysuria, hematuria.  ENDOCRINE: No polyuria, nocturia,  HEMATOLOGY: No anemia, easy bruising or bleeding SKIN: No rash or lesion. MUSCULOSKELETAL: No joint pain or arthritis.   NEUROLOGIC: Right-sided weakness, numbness more significant in the right upper extremity than the lower extremity PSYCHIATRY: No anxiety or depression.   DRUG ALLERGIES:  No Known Allergies  VITALS:  Blood pressure (!) 133/96, pulse 100, temperature 98.5 F (36.9 C), resp. rate 12, height 5\' 11"  (1.803 m), weight 115.7 kg, SpO2 92 %.  PHYSICAL EXAMINATION:  GENERAL:  38 y.o.-year-old patient lying in the bed with no acute distress.  EYES: Pupils equal, round, reactive to light and accommodation. No scleral icterus. Extraocular muscles intact.  HEENT: Head atraumatic, normocephalic. Oropharynx and nasopharynx clear.  NECK:  Supple, no jugular venous distention. No thyroid enlargement, no tenderness.  LUNGS: Normal breath sounds bilaterally, no wheezing, rales,rhonchi or crepitation. No use of accessory muscles of respiration.  CARDIOVASCULAR: S1, S2 normal. No murmurs, rubs, or gallops.  ABDOMEN: Soft, nontender, nondistended. Bowel sounds present. No organomegaly or mass.  EXTREMITIES: No pedal  edema, cyanosis, or clubbing.  NEUROLOGIC: Awake alert and oriented x3 sensation intact.  Right upper extremity motor is 4 out of 5, decreased light touch sensation gait not checked.  PSYCHIATRIC: The patient is alert and oriented x 3.  SKIN: No obvious rash, lesion, or ulcer.    LABORATORY PANEL:   CBC Recent Labs  Lab 11/22/18 0458  WBC 14.1*  HGB 13.6  HCT 40.9  PLT 261   ------------------------------------------------------------------------------------------------------------------  Chemistries  Recent Labs  Lab 11/20/18 0150  11/22/18 0458  NA 138   < > 137  K 3.6   < > 3.7  CL 104   < > 106  CO2 22   < > 24  GLUCOSE 96   < > 116*  BUN 13   < > 11  CREATININE 0.87   < > 0.74  CALCIUM 8.9   < > 8.2*  MG 2.3  --   --    < > = values in this interval not displayed.   ------------------------------------------------------------------------------------------------------------------  Cardiac Enzymes No results for input(s): TROPONINI in the last 168 hours. ------------------------------------------------------------------------------------------------------------------  RADIOLOGY:  Dg Cervical Spine 2 Or 3 Views  Result Date: 11/21/2018 CLINICAL DATA:  Status post cervical fusion 11/20/2018. EXAM: CERVICAL SPINE - 2-3 VIEW COMPARISON:  MRI cervical spine 11/19/2018. FINDINGS: The patient has undergone C3-6 cervical C3-6 cervical fusion since the prior exam. Hardware is in intact and appropriately positioned. No acute abnormality. IMPRESSION: Status post C3-6 fusion.  No acute finding. Electronically Signed   By: Drusilla Kannerhomas  Dalessio M.D.   On: 11/21/2018 09:52   Dg Cervical Spine 2-3 Views  Result Date: 11/20/2018 CLINICAL DATA:  38 year old male with cervical surgery EXAM: CERVICAL SPINE - 2-3 VIEW; DG C-ARM 61-120 MIN  COMPARISON:  MR 11/19/2018 FINDINGS: Limited intraoperative fluoroscopic spot images of the cervical spine. Images demonstrate C4-C5 corpectomy and  discectomy with anterior plate and screw fixation spanning C3-C7. IMPRESSION: Limited intraoperative spot images of cervical spine surgery, as above. Please refer to the dictated operative report for full details of intraoperative findings and procedure. Electronically Signed   By: Gilmer Mor D.O.   On: 11/20/2018 17:49   Dg C-arm 1-60 Min  Result Date: 11/20/2018 CLINICAL DATA:  38 year old male with cervical surgery EXAM: CERVICAL SPINE - 2-3 VIEW; DG C-ARM 61-120 MIN COMPARISON:  MR 11/19/2018 FINDINGS: Limited intraoperative fluoroscopic spot images of the cervical spine. Images demonstrate C4-C5 corpectomy and discectomy with anterior plate and screw fixation spanning C3-C7. IMPRESSION: Limited intraoperative spot images of cervical spine surgery, as above. Please refer to the dictated operative report for full details of intraoperative findings and procedure. Electronically Signed   By: Gilmer Mor D.O.   On: 11/20/2018 17:49    EKG:   Orders placed or performed during the hospital encounter of 12/12/16  . EKG 12-Lead  . EKG 12-Lead  . EKG 12-Lead  . 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    [employer] contacted for aspen brace.  Discharging patient home Drain removed without issue. Ancef d/c with drain removal. Appreciate neurosurgery recommendations   #History of CVA in the past Not on aspirin or statin LDL 92  #Leukocytosis -reactive S/P procedure  Discharging home from the intensive care unit  All the records are reviewed and case discussed with Care Management/Social Workerr. Management plans discussed with the patient, he is in agreement.  CODE STATUS: fc   TOTAL TIME TAKING CARE OF THIS  PATIENT: 29 minutes.     Note: This dictation was prepared with Dragon dictation along with smaller phrase technology. Any transcriptional errors that result from this process are unintentional.   Ramonita Lab M.D on 11/22/2018 at 12:49 PM  Between 7am to 6pm - Pager - [phone] After 6pm go to [email or web address] - password [email or web address]  [facility]  Office  [phone]  CC: Primary care physician; Patient, No Pcp Per
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
## 2018-11-22 NOTE — Progress Notes (Signed)
Neuro surgery, Estill Bamberg NP went over discharge instructions with patient. I went over information with Mrs. Smother over the phone. Patient and wife did not have any further questions. Follow up appointments made and ambulatory referral made for patient. Patient has prescriptions that he will need to take to pharmacy. Estill Bamberg NP aware that patient has not had bowel movement. She advised him to take stool softners. Care Manager met with patient and has given him information he requested. Removed IV's and helped patient her dressed. Patients wife will be picking him up.

## 2018-11-22 NOTE — Progress Notes (Signed)
Per Annabelle Harman NP patient can leave unit without nursing for physical therapy.

## 2018-11-22 NOTE — TOC Initial Note (Signed)
Transition of Care [facility]) - Initial/Assessment Note    Patient Details  Name: Donald Becker MRN: [id number] Date of Birth: Apr 26, 1981  Transition of Care [facility]) CM/SW Contact:    Allayne Butcher, RN Phone Number: 11/22/2018, 12:05 PM  Clinical Narrative:                 Patient is from home and lives with his wife.  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  Patient reports that wife provides transportation and he does not usually drive.  Patient will need OP PT and OT referral placed and faxed.  Patient is independent at baseline.  Discharge orders are in and wife will pick up patient.  Patient referred to [facility] and given resource booklet for affordable healthcare in [place].  Referral will be placed to [facility] but also encouraged patient to look into the other [facility] in the area.  Only outpatient prescription is for oxycodone.  Application given for [facility] and Medication Mangement- medication management does not provide pain medications.  Information on Good [facility] given to patient as well.    Expected Discharge Plan: OP Rehab Barriers to Discharge: No Barriers Identified   Patient Goals and CMS Choice   CMS Medicare.gov Compare Post Acute Care list provided to:: Patient Choice offered to / list presented to : Patient  Expected Discharge Plan and Services Expected Discharge Plan: OP Rehab   Discharge Planning Services: CM Consult Post Acute Care Choice: Durable Medical Equipment Living arrangements for the past 2 months: Single Family Home Expected Discharge Date: 11/22/18               DME Arranged: 3-N-1 DME Agency: [employer] Date DME Agency Contacted: 11/22/18 Time DME Agency Contacted: 1200 Representative spoke with at DME Agency: Mitchell Heir HH Arranged: PT, OT HH Agency: [facility] (now [facility])        Prior Living Arrangements/Services Living arrangements for the past 2 months:  Single Family Home Lives with:: Spouse Patient language and need for interpreter reviewed:: Yes Do you feel safe going back to the place where you live?: Yes      Need for Family Participation in Patient Care: Yes (Comment)(outpatient rehab and follow up) Care giver support system in place?: Yes (comment)(wife ) Current home services: (none) Criminal Activity/Legal Involvement Pertinent to Current Situation/Hospitalization: No - Comment as needed  Activities of Daily Living Home Assistive Devices/Equipment: None ADL Screening (condition at time of admission) Patient's cognitive ability adequate to safely complete daily activities?: Yes Is the patient deaf or have difficulty hearing?: No Does the patient have difficulty seeing, even when wearing glasses/contacts?: No Does the patient have difficulty concentrating, remembering, or making decisions?: Yes Patient able to express need for assistance with ADLs?: Yes Does the patient have difficulty dressing or bathing?: No Independently performs ADLs?: No Communication: Independent Does the patient have difficulty walking or climbing stairs?: Yes Weakness of Legs: None Weakness of Arms/Hands: Right  Permission Sought/Granted Permission sought to share information with : Case Manager Permission granted to share information with : Yes, Verbal Permission Granted              Emotional Assessment Appearance:: Appears stated age Attitude/Demeanor/Rapport: Engaged Affect (typically observed): Accepting Orientation: : Oriented to Self, Oriented to Place, Oriented to  Time, Oriented to Situation Alcohol / Substance Use: Tobacco Use Psych Involvement: No (comment)  Admission diagnosis:  Injury of head, initial encounter [  S09.90XA] Injury of cervical spinal cord, initial encounter (HCC) [S14.109A] Injury of neck, initial encounter [S19.9XXA] Patient Active Problem List   Diagnosis Date Noted  . Injury of cervical spine (HCC) 11/19/2018   . Abscess, neck 12/28/2016  . Neck abscess   . Altered mental status   . Pressure injury of skin 12/14/2016  . AKI (acute kidney injury) (HCC) 12/12/2016   PCP:  Patient, No Pcp Per Pharmacy:   [id number] - [place], [place] - [address] [place] [id number] Phone: [phone] Fax: [phone]     Social Determinants of Health (SDOH) Interventions    Readmission Risk Interventions No flowsheet data found.

## 2018-11-26 ENCOUNTER — Encounter: Payer: Self-pay | Admitting: Occupational Therapy

## 2018-11-26 ENCOUNTER — Ambulatory Visit: Payer: Medicaid Other | Attending: Student | Admitting: Occupational Therapy

## 2018-11-26 ENCOUNTER — Other Ambulatory Visit: Payer: Self-pay

## 2018-11-26 DIAGNOSIS — M6281 Muscle weakness (generalized): Secondary | ICD-10-CM | POA: Diagnosis present

## 2018-11-26 DIAGNOSIS — Z981 Arthrodesis status: Secondary | ICD-10-CM | POA: Insufficient documentation

## 2018-11-26 DIAGNOSIS — S14109A Unspecified injury at unspecified level of cervical spinal cord, initial encounter: Secondary | ICD-10-CM | POA: Insufficient documentation

## 2018-11-26 DIAGNOSIS — R208 Other disturbances of skin sensation: Secondary | ICD-10-CM | POA: Diagnosis present

## 2018-11-26 NOTE — Therapy (Signed)
[place] [facility] [address], [place], [address] Phone: [phone]   Fax:  [phone]  Occupational Therapy Evaluation  Patient Details  Name: Donald Becker MRN: [id number] Date of Birth: 08-Jan-1981 Referring Provider (OT): Dr Adriana Simas   Encounter Date: 11/26/2018  OT End of Session - 11/26/18 1706    Visit Number  1    Number of Visits  24    Date for OT Re-Evaluation  02/18/19    OT Start Time  1452    OT Stop Time  1602    OT Time Calculation (min)  70 min    Activity Tolerance  Patient tolerated treatment well;No increased pain    Behavior During Therapy  WFL for tasks assessed/performed       Past Medical History:  Diagnosis Date  . CVA (cerebral vascular accident) [facility])     Past Surgical History:  Procedure Laterality Date  . ANTERIOR CERVICAL CORPECTOMY N/A 11/20/2018   Procedure: ANTERIOR CERVICAL CORPECTOMY C4;  Surgeon: Lucy Chris, MD;  Location: [facility] ORS;  Service: Neurosurgery;  Laterality: N/A;  . ANTERIOR CERVICAL DECOMPRESSION/DISCECTOMY FUSION 4 LEVELS N/A 11/20/2018   Procedure: ANTERIOR CERVICAL DECOMPRESSION/DISCECTOMY FUSION 3 LEVELS;  Surgeon: Lucy Chris, MD;  Location: [facility] ORS;  Service: Neurosurgery;  Laterality: N/A;  . HERNIA REPAIR      There were no vitals filed for this visit.  Subjective Assessment - 11/26/18 1641    Subjective   My leg is doing great - walking up the stairs at home -14 slow but no issues - but my R arm is a sleep and weak - L hand I can use some what to feed myself with builtup handles , can raise my arm - some pins and needles  in elbow down to hand - I did not know and took my neck brace off Sat and Sunday - my paper from hospital said I could - but Dr said to keep on at all times - I we know - pain about 4-10/10 - sleeping in recliner - can only get comfortable in that     Pertinent History  Mr. Lauderback presented to the ED on 4/21 with right arm weakness  and numbness after an injury at home. 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  Pt had by Dr Adriana Simas on 4/22/  an anterior cervical decompression and fusion consisting of corpectomy and adjacent ACDF to relieve the pressure on the spinal cord - R LE back to pt able to climb stairs and no weakness per pt - but R UE weakness more than L     Patient Stated Goals  Want to get the use and strenght in my arms back so I can do my work again and play with my kids     Currently in Pain?  Yes    Pain Score  6     Pain Location  Shoulder   neck   Pain Orientation  Posterior    Pain Descriptors / Indicators  Sharp    Pain Type  Surgical pain    Pain Onset  In the past 7 days    Pain Frequency  Constant        [facility] OT Assessment - 11/26/18 0001      Assessment   Medical Diagnosis  Cervical C3-6 ACDF with carpectomy C4     Referring Provider (OT)  Dr  Cook    Onset Date/Surgical Date  11/20/18    Hand Dominance  Right    Next MD Visit  --   12/03/2018   Prior Therapy  --   PT and OT in acute care      Precautions   Precaution Comments  --   cervical precautions    Required Braces or Orthoses  Cervical Brace   Aspen during day and waterproof one for showers      Home  Environment   Lives With  Family      Prior Function   Vocation  Self employed   Holiday representative   Leisure  outdoors, swim with kids       AROM   Overall AROM Comments  Wrist flexion with making fist R , wrist drop with elbow flexion ,Opposition unable numbness and motion     Right Shoulder Extension  --   NT   Right Shoulder Flexion  90 Degrees   AAROM    Right Shoulder ABduction  --   60-80 AAROM increase pain first few reps    Right Shoulder Internal Rotation  45 Degrees    Left Shoulder Extension  --   NT   Left Shoulder Flexion  90 Degrees    Left Shoulder ABduction  90 Degrees    Left Shoulder Internal Rotation  45 Degrees    Right Elbow  Flexion  150    Right Elbow Extension  0    Left Elbow Flexion  --   -3/5   Left Elbow Extension  --   3/5   Right Forearm Pronation  90 Degrees    Right Forearm Supination  85 Degrees    Left Forearm Pronation  90 Degrees    Left Forearm Supination  90 Degrees    Right Wrist Extension  40 Degrees   3-/5   Right Wrist Flexion  --   3+/5   Right Wrist Radial Deviation  20 Degrees    Right Wrist Ulnar Deviation  30 Degrees    Left Wrist Extension  70 Degrees    Left Wrist Flexion  80 Degrees    Left Wrist Radial Deviation  20 Degrees    Left Wrist Ulnar Deviation  30 Degrees      Right Hand AROM   R Thumb Radial ABduction/ADduction 0-55  55    R Thumb Palmar ABduction/ADduction 0-45  45    R Thumb Opposition to Index  --   opposition to 3rd - side of 4th      Left Hand AROM   L Thumb Radial ADduction/ABduction 0-55  55    L Thumb Palmar ADduction/ABduction 0-45  45    L Thumb Opposition to Index  --   WNL        Written HEP provided and review with pt and wife: verbalize understanding   L shoulder AROM to 90 degrees flexion , ABD , 45 degrees internal rotation  R shoulder AAROM shoulder flexion 90 , ABD 60-90 pain free , 45 degrees internal rotation  Elbow flexion R - 3 positions - 6-8 reps - palm up, down and thumb up  Sup/pro 10 reps AROM  Standing UD and RD of wrist 10 reps Wrist extention with loose fist over armrest 10 reps  Making fist with wrist neutral - 6-10 reps Tapping of digits on clipboard 5 reps  Opposition to all digits  5 reps   Stop when feeling pull - pain should not be more than 1-2/10  KEEP collar on at all times  NO lifting or pulling             OT Education - 11/26/18 1705    Education Details  findings , protocol , precautions and HEP     Person(s) Educated  Patient;Spouse    Methods  Explanation;Demonstration;Verbal cues;Handout    Comprehension  Verbalized understanding;Returned demonstration       OT Short Term Goals -  11/26/18 1713      OT SHORT TERM GOAL #1   Title  Pt and wife to be independent in verbalize precautions     Baseline  need reminders and reed inforcement       OT SHORT TERM GOAL #2   Title  Pt and wife to be independent in HEP to show improvement in pain , AROM , and functional use     Baseline  very little knowledge of HEP     Time  6    Period  Weeks    Status  New    Target Date  01/07/19        OT Long Term Goals - 11/26/18 1715      OT LONG TERM GOAL #1   Title  R shoulder AROM improve to 90 degrees in all planes to wash face and eat fingerfood     Baseline  AAROM 60-80 degrees ABD with pain 6/10 at [facility], 90 AAROM flexion     Time  4    Period  Weeks    Status  New    Target Date  12/24/18      OT LONG TERM GOAL #2   Title  Pt bilateral shoulder AROM in all planes improve for pt to pull shirt over head and wash hair     Baseline  unable     Time  8    Period  Weeks    Status  New    Target Date  01/21/19      OT LONG TERM GOAL #3   Title  R elbow flexion and extention , wrist improve to lift more than 2 lbs to assist in bathing and dressing 25% or more     Baseline  Decrease flexion and wrist extention on R     Time  12    Period  Weeks    Status  New    Target Date  02/18/19      OT LONG TERM GOAL #4   Title  Quickdash score improve with more than 40 points     Baseline  score at eval 93.2    Time  12    Period  Weeks    Status  New    Target Date  02/18/19      OT LONG TERM GOAL #5   Title  update strength goals as needed     Baseline  only 7 days out from surgery     Time  4    Period  Weeks    Status  New    Target Date  12/24/18            Plan - 11/26/18 1707    Clinical Impression Statement  Pt is 7 days s/p C 3-6 ACDF and Carpectompy C4 - pt arrive with Aspen collar in place - report he had it off Sat and Sun some because of d/c instruction said he could but had increase pain - pain now about 6/10 posterior R shoulder - pt present at  OT  eval with R UE weakness more than L - and numbness on R UE - only was able to feel deep pressure with Phoebe PerchSemmes Weinstein ,  and pins and needles on L from elbow to hand - needed AAROM for L shoulder to 90 degrees , decrease AROM in R more than L , and decreas strength - all limiting his functional use of R dominant hand - review with pt and wife precautions and collar wearing - will contact surgeon about over 90 degrees ROM precautions - pt had stroke year ago and has STM issues - provided with HEP     OT Occupational Profile and History  Detailed Assessment- Review of Records and additional review of physical, cognitive, psychosocial history related to current functional performance    Occupational performance deficits (Please refer to evaluation for details):  ADL's;IADL's;Leisure;Play;Rest and Sleep;Work;Social Participation    Body Structure / Function / Physical Skills  ADL;Dexterity;Flexibility;ROM;Strength;Muscle spasms;IADL;FMC;Coordination;Decreased knowledge of precautions;Pain;Sensation;UE functional use;Proprioception;Decreased knowledge of use of DME    Rehab Potential  Good    Clinical Decision Making  Several treatment options, min-mod task modification necessary    Comorbidities Affecting Occupational Performance:  May have comorbidities impacting occupational performance    Modification or Assistance to Complete Evaluation   Min-Moderate modification of tasks or assist with assess necessary to complete eval    OT Frequency  --   1-2 x wk   OT Duration  12 weeks    OT Treatment/Interventions  Self-care/ADL training;Therapeutic exercise;DME and/or AE instruction;Manual Therapy;Energy conservation;Passive range of motion;Therapeutic activities;Patient/family education    Plan  assess progress and how did with HEP     OT Home Exercise Plan  see pt instructions     Consulted and Agree with Plan of Care  Patient       Patient will benefit from skilled therapeutic intervention in order to  improve the following deficits and impairments:  Body Structure / Function / Physical Skills  Visit Diagnosis: Injury of cervical spinal cord, initial encounter (HCC) - Plan: Ot plan of care cert/re-cert  S/P cervical spinal fusion - Plan: Ot plan of care cert/re-cert  Muscle weakness (generalized) - Plan: Ot plan of care cert/re-cert  Other disturbances of skin sensation - Plan: Ot plan of care cert/re-cert    Problem List Patient Active Problem List   Diagnosis Date Noted  . Injury of cervical spine (HCC) 11/19/2018  . Abscess, neck 12/28/2016  . Neck abscess   . Altered mental status   . Pressure injury of skin 12/14/2016  . AKI (acute kidney injury) (HCC) 12/12/2016    Oletta CohnuPreez, Jessamine Barcia OTR/L,CLT 11/26/2018, 5:28 PM  [facility] [address] [place], [place], [address] Phone: [phone]   Fax:  [phone]  Name: Donald Becker MRN: [id number] Date of Birth: 05/10/1981

## 2018-11-26 NOTE — Patient Instructions (Signed)
L shoulder AROM to 90 degrees flexion , ABD , 45 degrees internal rotation  R shoulder AAROM shoulder flexion 90 , ABD 60-90 pain free , 45 degrees internal rotation  Elbow flexion R - 3 positions - 6-8 reps - palm up, down and thumb up  Sup/pro 10 reps AROM  Standing UD and RD of wrist 10 reps Wrist extention with loose fist over armrest 10 reps  Making fist with wrist neutral - 6-10 reps Tapping of digits on clipboard 5 reps  Opposition to all digits  5 reps   Stop when feeling pull - pain should not be more than 1-2/10  KEEP collar on at all times  NO lifting or pulling

## 2018-12-02 ENCOUNTER — Other Ambulatory Visit: Payer: Self-pay

## 2018-12-02 ENCOUNTER — Ambulatory Visit: Payer: Medicaid Other | Attending: Student | Admitting: Occupational Therapy

## 2018-12-02 DIAGNOSIS — R208 Other disturbances of skin sensation: Secondary | ICD-10-CM | POA: Diagnosis present

## 2018-12-02 DIAGNOSIS — M6281 Muscle weakness (generalized): Secondary | ICD-10-CM

## 2018-12-02 DIAGNOSIS — Z981 Arthrodesis status: Secondary | ICD-10-CM | POA: Diagnosis present

## 2018-12-02 DIAGNOSIS — S14109A Unspecified injury at unspecified level of cervical spinal cord, initial encounter: Secondary | ICD-10-CM | POA: Insufficient documentation

## 2018-12-02 NOTE — Patient Instructions (Signed)
Upgrade all HEP - see note

## 2018-12-02 NOTE — Therapy (Signed)
[facility] [address] [place], [address] Phone: [phone]   Fax:  [phone]  Occupational Therapy Treatment  Patient Details  Name: Donald Becker MRN: [id number] Date of Birth: 1981-05-22 Referring Provider (OT): Dr Adriana Simas   Encounter Date: 12/02/2018  OT End of Session - 12/02/18 1609    Visit Number  2    Number of Visits  24    Date for OT Re-Evaluation  02/18/19    OT Start Time  1400    OT Stop Time  1500    OT Time Calculation (min)  60 min    Activity Tolerance  Patient tolerated treatment well;No increased pain    Behavior During Therapy  WFL for tasks assessed/performed       Past Medical History:  Diagnosis Date  . CVA (cerebral vascular accident) [facility])     Past Surgical History:  Procedure Laterality Date  . ANTERIOR CERVICAL CORPECTOMY N/A 11/20/2018   Procedure: ANTERIOR CERVICAL CORPECTOMY C4;  Surgeon: Lucy Chris, MD;  Location: [facility] ORS;  Service: Neurosurgery;  Laterality: N/A;  . ANTERIOR CERVICAL DECOMPRESSION/DISCECTOMY FUSION 4 LEVELS N/A 11/20/2018   Procedure: ANTERIOR CERVICAL DECOMPRESSION/DISCECTOMY FUSION 3 LEVELS;  Surgeon: Lucy Chris, MD;  Location: [facility] ORS;  Service: Neurosurgery;  Laterality: N/A;  . HERNIA REPAIR      There were no vitals filed for this visit.  Subjective Assessment - 12/02/18 1607    Subjective   Doing much better - the pain at the back of my shoulder still at times a lot - but not more than 4-5/10 and today coming in 1/10 -  my motion better and I can now drink and eat with my R hand some     Pertinent History  Donald Becker presented to the ED on 4/21 with right arm weakness and numbness after an injury at home. 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  Pt had by Dr Adriana Simas on 4/22/  an anterior cervical decompression and fusion consisting of corpectomy and adjacent  ACDF to relieve the pressure on the spinal cord - R LE back to pt able to climb stairs and no weakness per pt - but R UE weakness more than L     Patient Stated Goals  Want to get the use and strenght in my arms back so I can do my work again and play with my kids     Currently in Pain?  Yes    Pain Score  1     Pain Location  Shoulder    Pain Orientation  Posterior    Pain Type  Surgical pain    Pain Onset  In the past 7 days    Pain Frequency  Constant         [facility] OT Assessment - 12/02/18 0001      AROM   Right Shoulder Extension  55 Degrees    Right Shoulder Flexion  125 Degrees    Right Shoulder ABduction  130 Degrees    Right Shoulder Internal Rotation  60 Degrees    Left Shoulder Extension  60 Degrees    Left Shoulder Flexion  170 Degrees    Left Shoulder ABduction  170 Degrees    Left Shoulder Internal Rotation  80 Degrees    Right Elbow Flexion  150    Right Elbow Extension  -10    Left  Elbow Flexion  150    Left Elbow Extension  0    Right Forearm Pronation  90 Degrees    Right Forearm Supination  85 Degrees    Left Forearm Pronation  90 Degrees    Left Forearm Supination  90 Degrees    Right Wrist Extension  60 Degrees    Right Wrist Flexion  80 Degrees    Right Wrist Radial Deviation  20 Degrees    Right Wrist Ulnar Deviation  30 Degrees    Left Wrist Extension  70 Degrees    Left Wrist Flexion  80 Degrees    Left Wrist Radial Deviation  20 Degrees    Left Wrist Ulnar Deviation  30 Degrees      Right Hand AROM   R Thumb Opposition to Index  --   opposition to side of 5th -  but have to look     Left Hand AROM   L Thumb Opposition to Index  --   WNL      assess bilateral UE AROM from shoulder to hand - see flowsheet [facility] on the L - able to pick up small object with opposition -and F[facility] from hand <> finger tips - 5 objects  R sensation with Semmes Weinstein  - 3.61 in palmar hand - have to compensate with vision to pick up 2 cm foam block - alternating  digits - cannot do FMC hand <> finger tips   Increase R thumb IP flexion during [facility] act -  Add this date PA and RA of thumb on the R  Cont with tapping of digits L and R  And ADD/ABD of digits L and R    Provided again Written HEP  review with pt and wife after assessing progress : verbalize understanding   L shoulder AROM change to D1 and D2 patterns ;  External rotation   R shoulder  AROM and AAROM shoulder flexion over head per surgeon verbal order provided to this OT last week - pain free , cont external rotation  Elbow flexion R and L  - 3 positions  But do against wall to work on end range extention  With elbow to side sup/pro 10 reps AROM - focus to scoop with forearm  Standing UD and RD  of wrist 10 reps Wrist extention with loose fist over armrest 10 reps   PA and RA of thumb on the R  Cont with tapping of digits L and R  And ADD/ABD of digits L and R  FMC on the L hand <> palm 5-8 objects   And R hand touch finger tips -and opposition pick up 2 cm foam block    Stop when feeling pull - pain should not be more than 1-2/10  KEEP collar on at all times  NO lifting or pulling -no more than 5-10 lbs -  But should be not even this at the moment             OT Education - 12/02/18 1608    Education Details  progress , precautions - and upgrade HEP     Person(s) Educated  Patient;Spouse    Methods  Explanation;Demonstration;Verbal cues;Handout    Comprehension  Verbalized understanding;Returned demonstration       OT Short Term Goals - 11/26/18 1713      OT SHORT TERM GOAL #1   Title  Pt and wife to be independent in verbalize precautions     Baseline  need reminders  and reed inforcement       OT SHORT TERM GOAL #2   Title  Pt and wife to be independent in HEP to show improvement in pain , AROM , and functional use     Baseline  very little knowledge of HEP     Time  6    Period  Weeks    Status  New    Target Date  01/07/19        OT Long Term  Goals - 11/26/18 1715      OT LONG TERM GOAL #1   Title  R shoulder AROM improve to 90 degrees in all planes to wash face and eat fingerfood     Baseline  AAROM 60-80 degrees ABD with pain 6/10 at [facility], 90 AAROM flexion     Time  4    Period  Weeks    Status  New    Target Date  12/24/18      OT LONG TERM GOAL #2   Title  Pt bilateral shoulder AROM in all planes improve for pt to pull shirt over head and wash hair     Baseline  unable     Time  8    Period  Weeks    Status  New    Target Date  01/21/19      OT LONG TERM GOAL #3   Title  R elbow flexion and extention , wrist improve to lift more than 2 lbs to assist in bathing and dressing 25% or more     Baseline  Decrease flexion and wrist extention on R     Time  12    Period  Weeks    Status  New    Target Date  02/18/19      OT LONG TERM GOAL #4   Title  Quickdash score improve with more than 40 points     Baseline  score at eval 93.2    Time  12    Period  Weeks    Status  New    Target Date  02/18/19      OT LONG TERM GOAL #5   Title  update strength goals as needed     Baseline  only 7 days out from surgery     Time  4    Period  Weeks    Status  New    Target Date  12/24/18            Plan - 12/02/18 1609    Clinical Impression Statement  Pt is 12 days s/p C3-6 ACDF and carpectomy C4 - pt arrive with less pain than last week - still having some rough nights - but doing much better and  testing normal sensation on the L UE - but report still some sensation changes in hand - increase functional use - R UE show increase shoulder , elbow and forearm /wrist AROM - also increase sensation in R UE - testing 3.61 on R UE - was only last week deep pressure - but palmar hand still only deep pressure - pt issued new and upgraded HEP     OT Occupational Profile and History  Detailed Assessment- Review of Records and additional review of physical, cognitive, psychosocial history related to current functional performance     Occupational performance deficits (Please refer to evaluation for details):  ADL's;IADL's;Leisure;Play;Rest and Sleep;Work;Social Participation    Body Structure / Function / Physical Skills  ADL;Dexterity;Flexibility;ROM;Strength;Muscle spasms;IADL;FMC;Coordination;Decreased knowledge of  precautions;Pain;Sensation;UE functional use;Proprioception;Decreased knowledge of use of DME    Rehab Potential  Good    Clinical Decision Making  Several treatment options, min-mod task modification necessary    Comorbidities Affecting Occupational Performance:  May have comorbidities impacting occupational performance    Modification or Assistance to Complete Evaluation   Min-Moderate modification of tasks or assist with assess necessary to complete eval    OT Frequency  --   1-2 x wk   OT Duration  12 weeks    OT Treatment/Interventions  Self-care/ADL training;Therapeutic exercise;DME and/or AE instruction;Manual Therapy;Energy conservation;Passive range of motion;Therapeutic activities;Patient/family education    Plan  assess progress and upgrade  HEP     OT Home Exercise Plan  see pt instructions     Consulted and Agree with Plan of Care  Patient       Patient will benefit from skilled therapeutic intervention in order to improve the following deficits and impairments:  Body Structure / Function / Physical Skills  Visit Diagnosis: Injury of cervical spinal cord, initial encounter (HCC)  S/P cervical spinal fusion  Muscle weakness (generalized)  Other disturbances of skin sensation    Problem List Patient Active Problem List   Diagnosis Date Noted  . Injury of cervical spine (HCC) 11/19/2018  . Abscess, neck 12/28/2016  . Neck abscess   . Altered mental status   . Pressure injury of skin 12/14/2016  . AKI (acute kidney injury) (HCC) 12/12/2016    Oletta Cohn OTR/L,CLT 12/02/2018, 4:18 PM  [place] [facility] [address], [place], [address] Phone: [phone]   Fax:  [phone]  Name: Juddson Cobern MRN: [id number] Date of Birth: Dec 03, 1980

## 2018-12-09 ENCOUNTER — Other Ambulatory Visit: Payer: Self-pay

## 2018-12-09 ENCOUNTER — Ambulatory Visit: Payer: Medicaid Other | Admitting: Occupational Therapy

## 2018-12-09 DIAGNOSIS — S14109A Unspecified injury at unspecified level of cervical spinal cord, initial encounter: Secondary | ICD-10-CM

## 2018-12-09 DIAGNOSIS — R208 Other disturbances of skin sensation: Secondary | ICD-10-CM

## 2018-12-09 DIAGNOSIS — Z981 Arthrodesis status: Secondary | ICD-10-CM

## 2018-12-09 DIAGNOSIS — M6281 Muscle weakness (generalized): Secondary | ICD-10-CM

## 2018-12-09 NOTE — Therapy (Signed)
[place] [facility] [address] [place], [address] Phone: [phone]   Fax:  [phone]  Occupational Therapy Treatment  Patient Details  Name: Donald Becker MRN: [id number] Date of Birth: 06-Oct-1980 Referring Provider (OT): Dr Adriana Simas   Encounter Date: 12/09/2018  OT End of Session - 12/09/18 1054    Visit Number  3    Number of Visits  24    Date for OT Re-Evaluation  02/18/19    OT Start Time  0805    OT Stop Time  0855    OT Time Calculation (min)  50 min    Activity Tolerance  Patient tolerated treatment well;No increased pain    Behavior During Therapy  WFL for tasks assessed/performed       Past Medical History:  Diagnosis Date  . CVA (cerebral vascular accident) [facility])     Past Surgical History:  Procedure Laterality Date  . ANTERIOR CERVICAL CORPECTOMY N/A 11/20/2018   Procedure: ANTERIOR CERVICAL CORPECTOMY C4;  Surgeon: Lucy Chris, MD;  Location: [facility] ORS;  Service: Neurosurgery;  Laterality: N/A;  . ANTERIOR CERVICAL DECOMPRESSION/DISCECTOMY FUSION 4 LEVELS N/A 11/20/2018   Procedure: ANTERIOR CERVICAL DECOMPRESSION/DISCECTOMY FUSION 3 LEVELS;  Surgeon: Lucy Chris, MD;  Location: [facility] ORS;  Service: Neurosurgery;  Laterality: N/A;  . HERNIA REPAIR      There were no vitals filed for this visit.  Subjective Assessment - 12/09/18 1051    Subjective   Seen the PA and Dr Adriana Simas - doing okay - did xray -and everything good- because I did take my collar off that first few days after discharge- only taking Tylenol , Muscle relaxer and gabapentin - doing good- I just forget everything - do not have short term memory     Patient Stated Goals  Want to get the use and strenght in my arms back so I can do my work again and play with my kids     Currently in Pain?  Yes    Pain Score  2     Pain Location  Shoulder    Pain Orientation  Right    Pain Descriptors / Indicators  Aching;Shooting    Pain Type   Surgical pain    Pain Onset  1 to 4 weeks ago         [facility] OT Assessment - 12/09/18 0001      AROM   Right Shoulder Flexion  145 Degrees    Right Shoulder ABduction  135 Degrees    Right Elbow Extension  0    Left Elbow Extension  0        assess bilateral UE AROM from shoulder to hand - see flowsheet and made great progress - strength in RD, UD, sup , pronation 5/5    FMC on the L - able to pick up small object with opposition -and FMC from hand <> finger tips - 5 objects - to cont to practice  R sensation with Phoebe Perch  - 3.61 in palmar hand - have to compensate with vision to pick up 2 cm foam block - alternating digits - cannot do FMC hand <> finger tips  to cont with    decreaes  R thumb IP flexion during [facility] act this date  Add this date PA and RA of thumb doing rubber band on the R  Provided again Written HEP  review with pt and wife after assessing progress : verbalize understanding  L shoulder AROM  cont to do  D1 and D2 patterns ;  External rotation   R shoulder  change to AAROM on wall for flexion  and ABD - pain free - over head per surgeon verbal order provided to this OT last week - pain free , cont external rotation  Wrist extention with loose fist over armrest 10 reps - 3 x day  Add scapula squeezes and protraction/retraction for R - 10 reps supine     Stop when feeling pull - pain should not be more than 1-2/10  KEEP collar on at all times  NO lifting or pulling-no more than 5-10 lbs -  But should be not even this at the moment                 OT Education - 12/09/18 1054    Education Details  progress , precautions - and upgrade HEP     Person(s) Educated  Patient;Spouse    Methods  Explanation;Demonstration;Verbal cues;Handout    Comprehension  Verbalized understanding;Returned demonstration       OT Short Term Goals - 11/26/18 1713      OT SHORT TERM GOAL #1   Title  Pt and wife to be independent in verbalize  precautions     Baseline  need reminders and reed inforcement       OT SHORT TERM GOAL #2   Title  Pt and wife to be independent in HEP to show improvement in pain , AROM , and functional use     Baseline  very little knowledge of HEP     Time  6    Period  Weeks    Status  New    Target Date  01/07/19        OT Long Term Goals - 11/26/18 1715      OT LONG TERM GOAL #1   Title  R shoulder AROM improve to 90 degrees in all planes to wash face and eat fingerfood     Baseline  AAROM 60-80 degrees ABD with pain 6/10 at [facility], 90 AAROM flexion     Time  4    Period  Weeks    Status  New    Target Date  12/24/18      OT LONG TERM GOAL #2   Title  Pt bilateral shoulder AROM in all planes improve for pt to pull shirt over head and wash hair     Baseline  unable     Time  8    Period  Weeks    Status  New    Target Date  01/21/19      OT LONG TERM GOAL #3   Title  R elbow flexion and extention , wrist improve to lift more than 2 lbs to assist in bathing and dressing 25% or more     Baseline  Decrease flexion and wrist extention on R     Time  12    Period  Weeks    Status  New    Target Date  02/18/19      OT LONG TERM GOAL #4   Title  Quickdash score improve with more than 40 points     Baseline  score at eval 93.2    Time  12    Period  Weeks    Status  New    Target Date  02/18/19      OT LONG TERM GOAL #5   Title  update strength goals as needed  Baseline  only 7 days out from surgery     Time  4    Period  Weeks    Status  New    Target Date  12/24/18            Plan - 12/09/18 1054    Clinical Impression Statement  Pt is about 19 days s/p C3-C6 ACDF and carpectomy C4 - pt arrive with less pain this date again and incease in sensation in R hand - able to feel 4.31 in thenar eminence  - FMC improving in L hand  and increase sensation in finger tips - pt still having R shouder pain and decrease ROM - forward shoulder this date standing - and decrease wrist  extention - compensate with long finger extensors - pt report using hands more in ADL's - but because of STM loss - pt forgets precautions- reinforce again -and daughters to make large signs to put up in house to remind him     OT Occupational Profile and History  Detailed Assessment- Review of Records and additional review of physical, cognitive, psychosocial history related to current functional performance    Occupational performance deficits (Please refer to evaluation for details):  ADL's;IADL's;Leisure;Play;Rest and Sleep;Work;Social Participation    Body Structure / Function / Physical Skills  ADL;Dexterity;Flexibility;ROM;Strength;Muscle spasms;IADL;FMC;Coordination;Decreased knowledge of precautions;Pain;Sensation;UE functional use;Proprioception;Decreased knowledge of use of DME    Rehab Potential  Good    Clinical Decision Making  Several treatment options, min-mod task modification necessary    Comorbidities Affecting Occupational Performance:  May have comorbidities impacting occupational performance    Modification or Assistance to Complete Evaluation   Min-Moderate modification of tasks or assist with assess necessary to complete eval    OT Frequency  1x / week    OT Duration  --   10 wks   Plan  assess progress and upgrade  HEP     OT Home Exercise Plan  see pt instructions     Consulted and Agree with Plan of Care  Patient       Patient will benefit from skilled therapeutic intervention in order to improve the following deficits and impairments:  Body Structure / Function / Physical Skills  Visit Diagnosis: Injury of cervical spinal cord, initial encounter (HCC)  S/P cervical spinal fusion  Muscle weakness (generalized)  Other disturbances of skin sensation    Problem List Patient Active Problem List   Diagnosis Date Noted  . Injury of cervical spine (HCC) 11/19/2018  . Abscess, neck 12/28/2016  . Neck abscess   . Altered mental status   . Pressure injury of skin  12/14/2016  . AKI (acute kidney injury) (HCC) 12/12/2016    Oletta CohnuPreez, Maddock Finigan OTR/L,CLT 12/09/2018, 10:59 AM  [place] [facility] [address] [place], [place], [address] Phone: [phone]   Fax:  [phone]  Name: Donald Becker MRN: [id number] Date of Birth: 03/15/1981

## 2018-12-09 NOTE — Patient Instructions (Signed)
See note

## 2018-12-16 ENCOUNTER — Ambulatory Visit: Payer: Medicaid Other | Admitting: Occupational Therapy

## 2018-12-16 ENCOUNTER — Other Ambulatory Visit: Payer: Self-pay

## 2018-12-16 DIAGNOSIS — M6281 Muscle weakness (generalized): Secondary | ICD-10-CM

## 2018-12-16 DIAGNOSIS — S14109A Unspecified injury at unspecified level of cervical spinal cord, initial encounter: Secondary | ICD-10-CM | POA: Diagnosis not present

## 2018-12-16 DIAGNOSIS — R208 Other disturbances of skin sensation: Secondary | ICD-10-CM

## 2018-12-16 DIAGNOSIS — Z981 Arthrodesis status: Secondary | ICD-10-CM

## 2018-12-16 NOTE — Therapy (Signed)
[place] [facility] [address] [place], [place], [address] Phone: [phone]   Fax:  [phone]  Occupational Therapy Treatment  Patient Details  Name: Donald Becker MRN: [id number] Date of Birth: 10/18/1980 Referring Provider (OT): Dr Adriana Simasook   Encounter Date: 12/16/2018  OT End of Session - 12/16/18 0900    Visit Number  4    Number of Visits  24    Date for OT Re-Evaluation  02/18/19    OT Start Time  0902    OT Stop Time  0950    OT Time Calculation (min)  48 min    Activity Tolerance  Patient tolerated treatment well;No increased pain    Behavior During Therapy  WFL for tasks assessed/performed       Past Medical History:  Diagnosis Date  . CVA (cerebral vascular accident) [facility](HCC)     Past Surgical History:  Procedure Laterality Date  . ANTERIOR CERVICAL CORPECTOMY N/A 11/20/2018   Procedure: ANTERIOR CERVICAL CORPECTOMY C4;  Surgeon: Lucy Chrisook, Steven, MD;  Location: [facility] ORS;  Service: Neurosurgery;  Laterality: N/A;  . ANTERIOR CERVICAL DECOMPRESSION/DISCECTOMY FUSION 4 LEVELS N/A 11/20/2018   Procedure: ANTERIOR CERVICAL DECOMPRESSION/DISCECTOMY FUSION 3 LEVELS;  Surgeon: Lucy Chrisook, Steven, MD;  Location: [facility] ORS;  Service: Neurosurgery;  Laterality: N/A;  . HERNIA REPAIR      There were no vitals filed for this visit.  Subjective Assessment - 12/16/18 0953    Subjective   Doing okay - doing better with my shoulder motion - the numbness or funny feeling area in my R upper arm getting smaller - and not taking the muscle relaxer to pain meds - only Tylenol - I want to see how I do on the sensation test     Pertinent History  Mr. Donald Becker presented to the ED on 4/21 with right arm weakness and numbness after an injury at home. 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  Pt had by Dr Adriana Simasook on 4/22/  an anterior cervical decompression and fusion  consisting of corpectomy and adjacent ACDF to relieve the pressure on the spinal cord - R LE back to pt able to climb stairs and no weakness per pt - but R UE weakness more than L     Patient Stated Goals  Want to get the use and strenght in my arms back so I can do my work again and play with my kids     Currently in Pain?  No/denies         [facility] - 12/16/18 0001      AROM   Right Shoulder Extension  60 Degrees    Right Shoulder Flexion  160 Degrees    Right Shoulder ABduction  155 Degrees    Right Elbow Extension  0   but need cueing to extend elbow during shoulder motion   Left Elbow Extension  0    Right Wrist Extension  60 Degrees        assess bilateral UE AROM from shoulder to hand - see flowsheet and made great progress - strength in RD, UD, sup , pronation 5/5    FMC on the L - able to pick up small object with opposition -and FMC from hand <>finger tips - 5 objects - to cont to practice  L sensation testing Normal on Semmes weinstein   R sensation with Phoebe PerchSemmes Weinstein -  3.61in palm and 4.56 in middle phalanges of all digits  Pt identify with close eyes - 2/4 object - to work on that at home to  Pt to get rice bowl with some smaller objects -and work on getting them out with out vision     decrease  R thumb IP flexion during [facility] act still  this date  Cont with  PA and RA of thumb doing rubber band on the R  Provided againWritten HEP review with pt and wife after assessing progress: verbalize understanding  L shoulder AROM cont to do  D1 and D2 patterns ; 3 sets of 10 - 2 x day   R shoulder cont with AAROM on wall for flexion and ABD - pain free - over head per surgeon verbal order provided to this OT 2 wks ago -pain free ,cont external rotation And add this date D1 and D2 pattern - but 2 x 10 - and increase to 3 sets in 3 days-wife to assist pt with quality of motion -and target   Focus to do end range of elbow extention during shoulder HEP    Wrist extention with tight fist over armrest  2 x 10 reps - 2 x day  Increase in 3 days to 3 sets  Cont with  scapula squeezes and protraction/retraction for R - 10 reps supine     Stop when feeling pull - pain should not be more than 1-2/10  KEEP collar on at all times  Pt was able to hold about 2 lbs in R hand without issues - but cant be to wide - have to feel object in palm  NO lifting or pulling-no more than 5-10 lbs - But should be not even this at the moment                  OT Education - 12/16/18 0900    Education Details  progress , precautions - and upgrade HEP     Person(s) Educated  Patient;Spouse    Methods  Explanation;Demonstration;Verbal cues;Handout    Comprehension  Verbalized understanding;Returned demonstration       OT Short Term Goals - 11/26/18 1713      OT SHORT TERM GOAL #1   Title  Pt and wife to be independent in verbalize precautions     Baseline  need reminders and reed inforcement       OT SHORT TERM GOAL #2   Title  Pt and wife to be independent in HEP to show improvement in pain , AROM , and functional use     Baseline  very little knowledge of HEP     Time  6    Period  Weeks    Status  New    Target Date  01/07/19        OT Long Term Goals - 11/26/18 1715      OT LONG TERM GOAL #1   Title  R shoulder AROM improve to 90 degrees in all planes to wash face and eat fingerfood     Baseline  AAROM 60-80 degrees ABD with pain 6/10 at [facility], 90 AAROM flexion     Time  4    Period  Weeks    Status  New    Target Date  12/24/18      OT LONG TERM GOAL #2   Title  Pt bilateral shoulder AROM in all planes improve for pt to pull shirt over head and wash hair  Baseline  unable     Time  8    Period  Weeks    Status  New    Target Date  01/21/19      OT LONG TERM GOAL #3   Title  R elbow flexion and extention , wrist improve to lift more than 2 lbs to assist in bathing and dressing 25% or more     Baseline   Decrease flexion and wrist extention on R     Time  12    Period  Weeks    Status  New    Target Date  02/18/19      OT LONG TERM GOAL #4   Title  Quickdash score improve with more than 40 points     Baseline  score at eval 93.2    Time  12    Period  Weeks    Status  New    Target Date  02/18/19      OT LONG TERM GOAL #5   Title  update strength goals as needed     Baseline  only 7 days out from surgery     Time  4    Period  Weeks    Status  New    Target Date  12/24/18            Plan - 12/16/18 0900    Clinical Impression Statement  Pt is about 4 wks s/p C3-C6 ACDF and carpectomy C4 - pt arrive with reports that pain is better - only taking Tylenol and able to sleep in bed - still taking Gabepentin - pt show great progress in R shoulder AROM over head - fatigue and need to work on reps and sets - quality of motion and focus on end range elbow extnetion - cont to focus on wrist extentsors - without compensation of long extensors -Sensation improve to 4.31 in  R palm and 4.56 to middle phalanges - pt to work on sensation reed tasks     OT Occupational Profile and History  Detailed Assessment- Review of Records and additional review of physical, cognitive, psychosocial history related to current functional performance    Occupational performance deficits (Please refer to evaluation for details):  ADL's;IADL's;Leisure;Play;Rest and Sleep;Work;Social Participation    Body Structure / Function / Physical Skills  ADL;Dexterity;Flexibility;ROM;Strength;Muscle spasms;IADL;FMC;Coordination;Decreased knowledge of precautions;Pain;Sensation;UE functional use;Proprioception;Decreased knowledge of use of DME    Rehab Potential  Good    Clinical Decision Making  Several treatment options, min-mod task modification necessary    Comorbidities Affecting Occupational Performance:  May have comorbidities impacting occupational performance    Modification or Assistance to Complete Evaluation    Min-Moderate modification of tasks or assist with assess necessary to complete eval    OT Frequency  1x / week    OT Duration  --   9 wks   OT Treatment/Interventions  Self-care/ADL training;Therapeutic exercise;DME and/or AE instruction;Manual Therapy;Energy conservation;Passive range of motion;Therapeutic activities;Patient/family education    Plan  assess progress and upgrade  HEP     OT Home Exercise Plan  see pt instructions     Consulted and Agree with Plan of Care  Patient       Patient will benefit from skilled therapeutic intervention in order to improve the following deficits and impairments:  Body Structure / Function / Physical Skills  Visit Diagnosis: Injury of cervical spinal cord, initial encounter (HCC)  S/P cervical spinal fusion  Muscle weakness (generalized)  Other disturbances of skin sensation  Problem List Patient Active Problem List   Diagnosis Date Noted  . Injury of cervical spine (HCC) 11/19/2018  . Abscess, neck 12/28/2016  . Neck abscess   . Altered mental status   . Pressure injury of skin 12/14/2016  . AKI (acute kidney injury) (HCC) 12/12/2016    Oletta Cohn OTR/L,CLT 12/16/2018, 10:00 AM  [place] [facility] [address] [place], [address] Phone: [phone]   Fax:  [phone]  Name: Herberto Rolon MRN: [id number] Date of Birth: 1980-12-24

## 2018-12-16 NOTE — Patient Instructions (Signed)
See note

## 2018-12-24 ENCOUNTER — Ambulatory Visit: Payer: Medicaid Other | Admitting: Occupational Therapy

## 2019-01-17 ENCOUNTER — Other Ambulatory Visit: Payer: Self-pay | Admitting: Orthopedic Surgery

## 2019-01-17 DIAGNOSIS — M25511 Pain in right shoulder: Secondary | ICD-10-CM

## 2019-01-28 ENCOUNTER — Other Ambulatory Visit: Payer: Self-pay

## 2019-01-28 ENCOUNTER — Ambulatory Visit
Admission: RE | Admit: 2019-01-28 | Discharge: 2019-01-28 | Disposition: A | Discharge status: Home / Self Care | Payer: Medicaid Other | Source: Ambulatory Visit | Attending: Orthopedic Surgery | Admitting: Orthopedic Surgery

## 2019-01-28 DIAGNOSIS — M25511 Pain in right shoulder: Secondary | ICD-10-CM | POA: Insufficient documentation

## 2020-03-12 IMAGING — RF CERVICAL SPINE - 2-3 VIEW
1 series · 13 of 13 positions shown · non-contrast
Comparison: MR 11/19/2018

CLINICAL DATA: 38-year-old male with cervical surgery

EXAM:
CERVICAL SPINE - 2-3 VIEW; DG C-ARM 61-120 MIN

[Series 1: unknown protocol · 0.14mm/px · 13 of 13 slices shown]
[im 1/13]
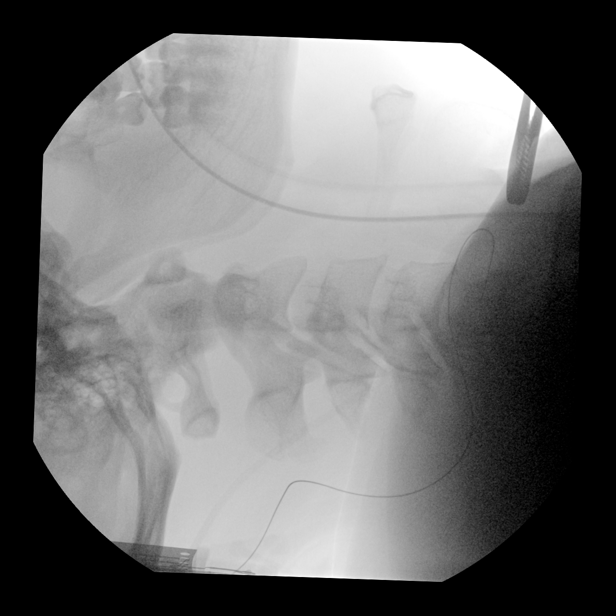
[im 2/13]
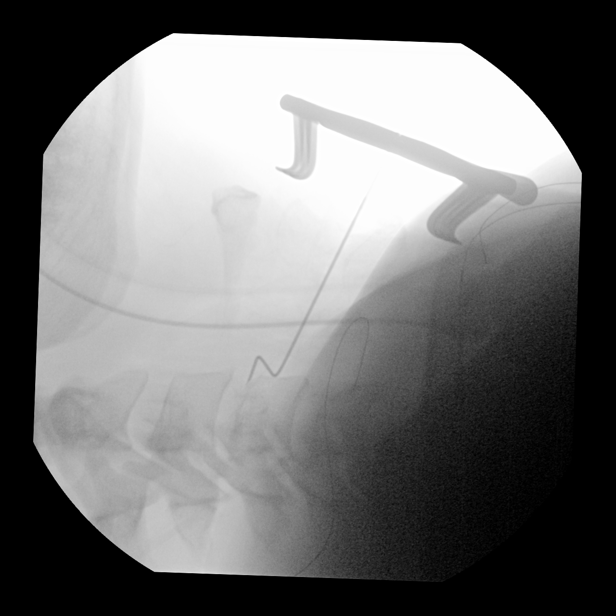
[im 3/13]
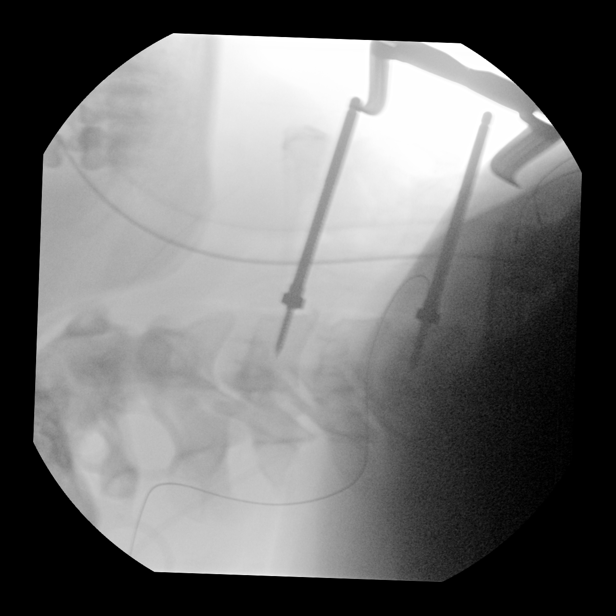
[im 4/13]
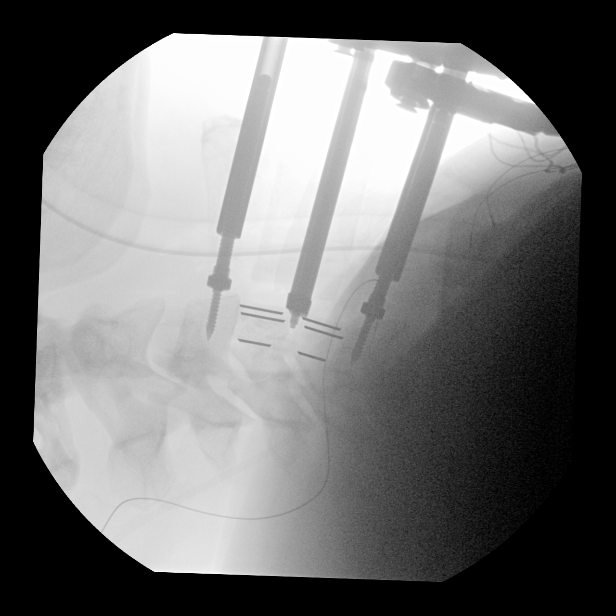
[im 5/13]
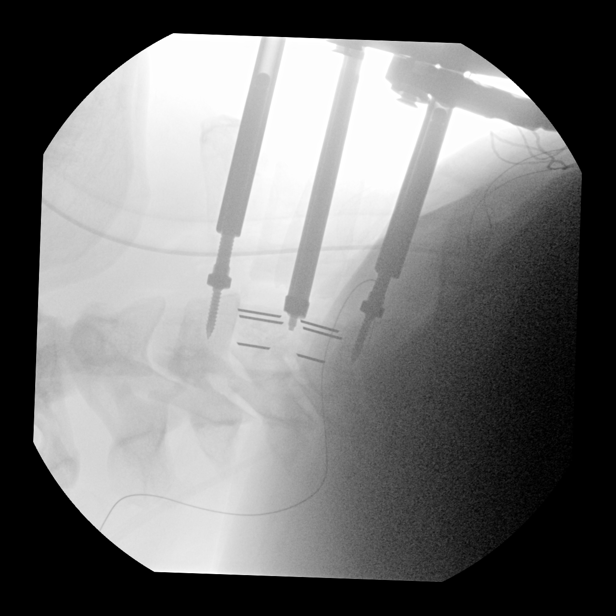
[im 6/13]
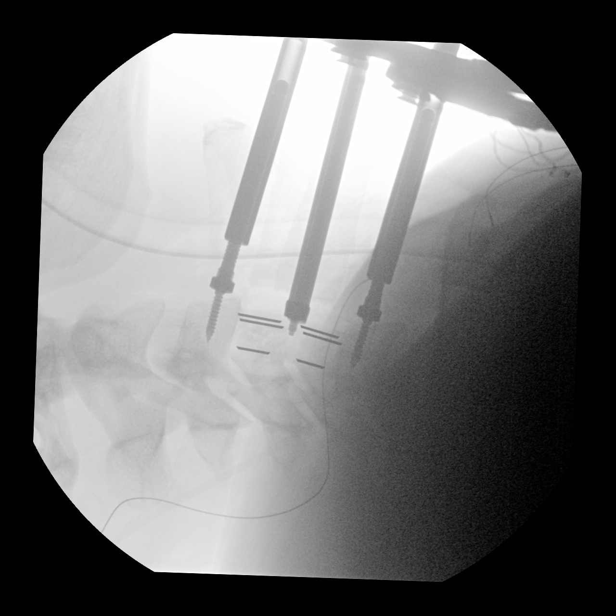
[im 7/13]
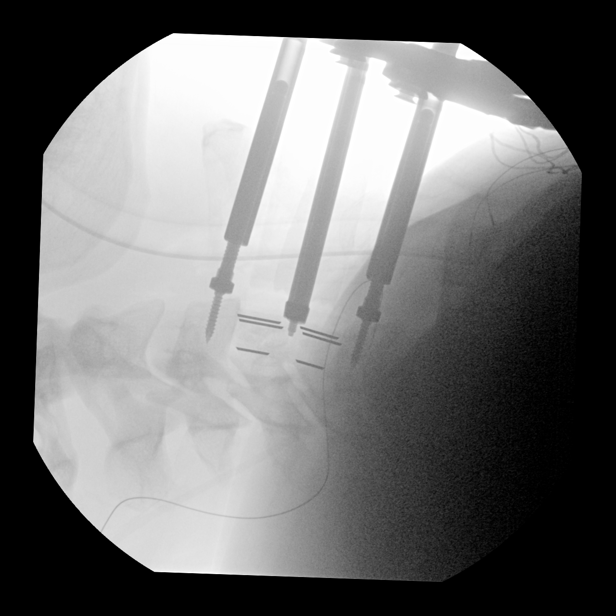
[im 8/13]
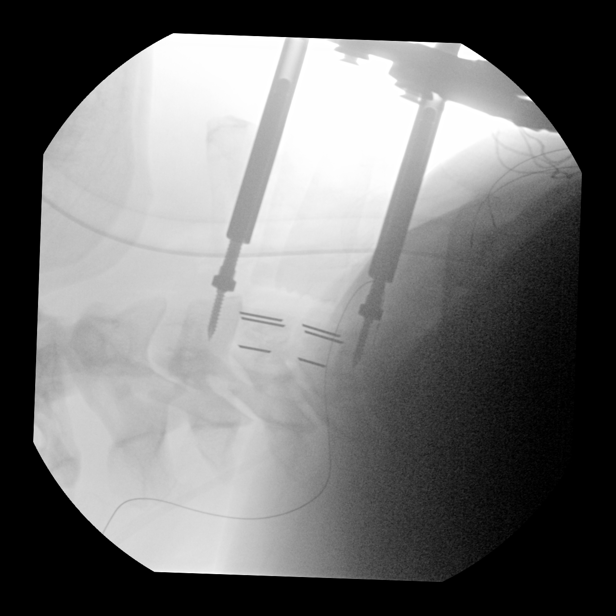
[im 9/13]
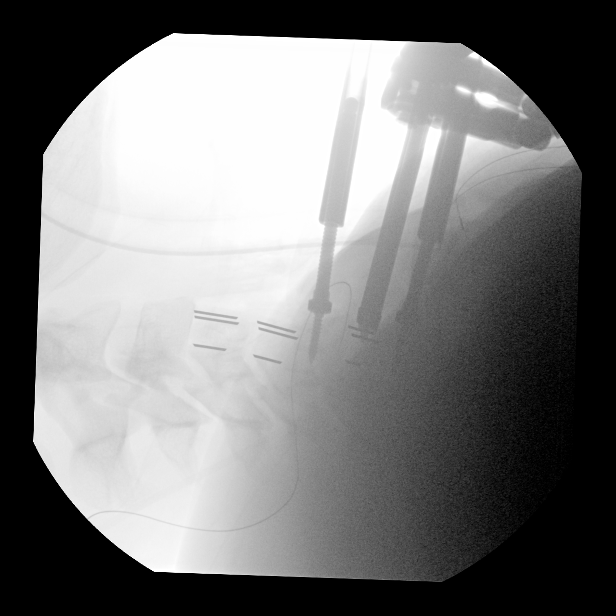
[im 10/13]
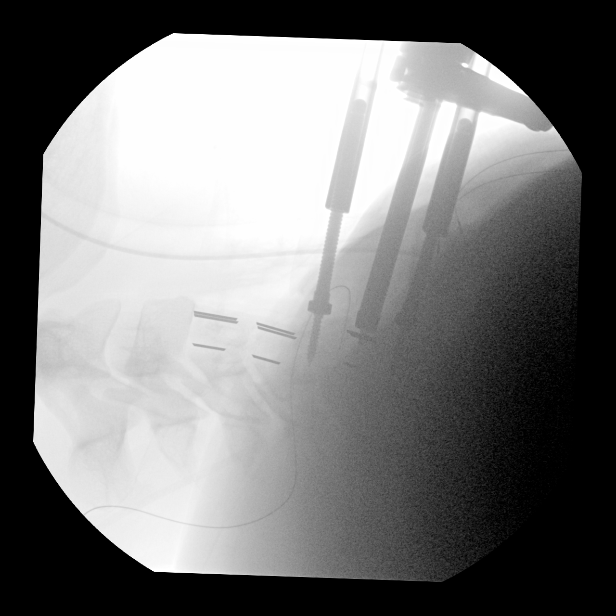
[im 11/13]
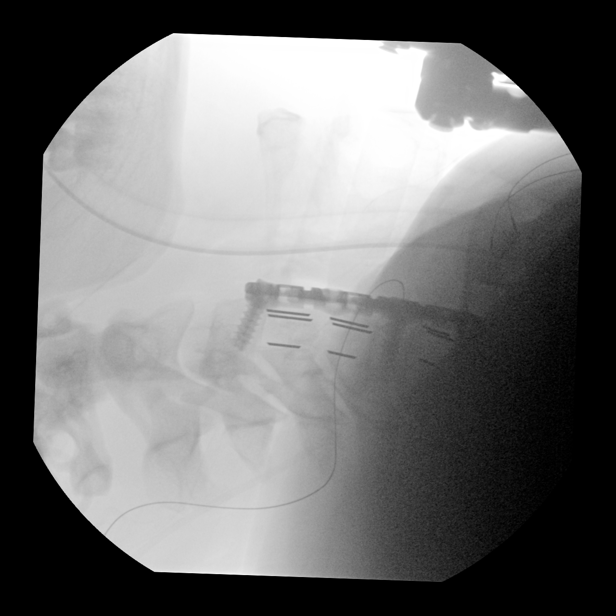
[im 12/13]
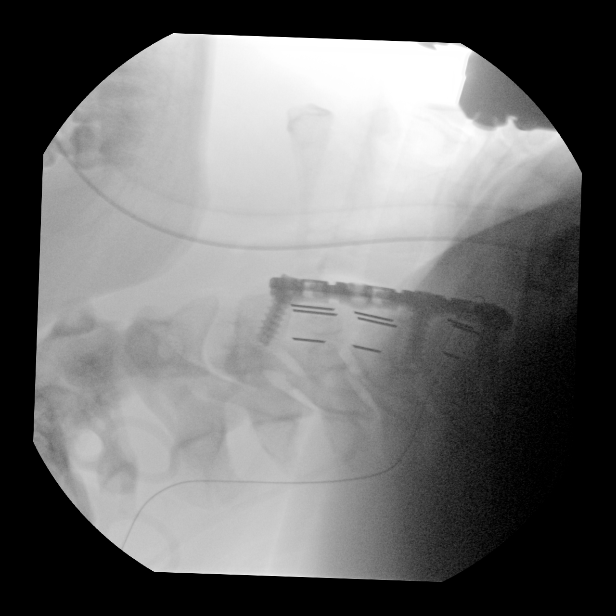
[im 13/13]
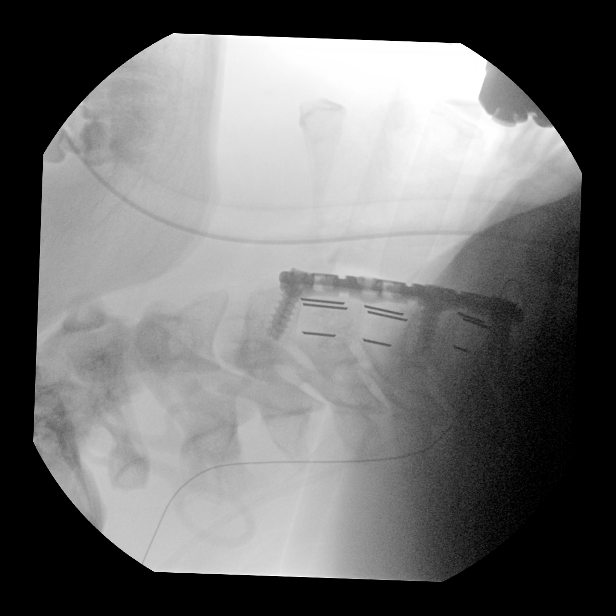

[13 of 13 positions shown; findings below may reference images not displayed]

FINDINGS: Limited intraoperative fluoroscopic spot images of the cervical
spine. Images demonstrate C4-C5 corpectomy and discectomy with
anterior plate and screw fixation spanning C3-C7.
IMPRESSION: Limited intraoperative spot images of cervical spine surgery, as
above. Please refer to the dictated operative report for full
details of intraoperative findings and procedure.
